# Patient Record
Sex: Female | Born: 1965
Health system: Southern US, Community
[De-identification: ages and names within clinical notes are randomized; demographics above are authoritative.]

## PROBLEM LIST (undated history)

## (undated) DIAGNOSIS — E785 Hyperlipidemia, unspecified: Secondary | ICD-10-CM

## (undated) DIAGNOSIS — T7840XA Allergy, unspecified, initial encounter: Secondary | ICD-10-CM

## (undated) DIAGNOSIS — L509 Urticaria, unspecified: Secondary | ICD-10-CM

## (undated) DIAGNOSIS — F32A Depression, unspecified: Secondary | ICD-10-CM

## (undated) DIAGNOSIS — F419 Anxiety disorder, unspecified: Secondary | ICD-10-CM

## (undated) DIAGNOSIS — I10 Essential (primary) hypertension: Secondary | ICD-10-CM

## (undated) HISTORY — PX: TONSILLECTOMY: SUR1361

## (undated) HISTORY — DX: Anxiety disorder, unspecified: F41.9

## (undated) HISTORY — PX: ADENOIDECTOMY: SUR15

## (undated) HISTORY — DX: Allergy, unspecified, initial encounter: T78.40XA

## (undated) HISTORY — DX: Hyperlipidemia, unspecified: E78.5

## (undated) HISTORY — DX: Depression, unspecified: F32.A

## (undated) HISTORY — DX: Urticaria, unspecified: L50.9

## (undated) HISTORY — DX: Essential (primary) hypertension: I10

---

## 2006-04-16 HISTORY — PX: BREAST EXCISIONAL BIOPSY: SUR124

## 2019-05-25 ENCOUNTER — Ambulatory Visit (INDEPENDENT_AMBULATORY_CARE_PROVIDER_SITE_OTHER): Payer: 59 | Admitting: Allergy and Immunology

## 2019-05-25 ENCOUNTER — Encounter: Payer: Self-pay | Admitting: Allergy and Immunology

## 2019-05-25 ENCOUNTER — Other Ambulatory Visit: Payer: Self-pay

## 2019-05-25 VITALS — BP 120/80 | HR 82 | Temp 98.0°F | Resp 16 | Ht 62.0 in | Wt 155.4 lb

## 2019-05-25 DIAGNOSIS — L500 Allergic urticaria: Secondary | ICD-10-CM

## 2019-05-25 DIAGNOSIS — K2970 Gastritis, unspecified, without bleeding: Secondary | ICD-10-CM | POA: Diagnosis not present

## 2019-05-25 DIAGNOSIS — J310 Chronic rhinitis: Secondary | ICD-10-CM | POA: Diagnosis not present

## 2019-05-25 DIAGNOSIS — T7840XD Allergy, unspecified, subsequent encounter: Secondary | ICD-10-CM

## 2019-05-25 DIAGNOSIS — J309 Allergic rhinitis, unspecified: Secondary | ICD-10-CM | POA: Insufficient documentation

## 2019-05-25 HISTORY — DX: Allergic urticaria: L50.0

## 2019-05-25 MED ORDER — AZELASTINE HCL 0.1 % NA SOLN: NASAL | 5 refills | Status: DC

## 2019-05-25 MED ORDER — MUCINEX 600 MG PO TB12: ORAL_TABLET | ORAL | 5 refills | Status: DC

## 2019-05-25 MED ORDER — FAMOTIDINE 20 MG PO TABS: 20.0000 mg | ORAL_TABLET | Freq: Two times a day (BID) | ORAL | 5 refills | Status: DC | PRN

## 2019-05-25 MED ORDER — LEVOCETIRIZINE DIHYDROCHLORIDE 5 MG PO TABS: 5.0000 mg | ORAL_TABLET | Freq: Every day | ORAL | 5 refills | Status: DC | PRN

## 2019-05-25 NOTE — Assessment & Plan Note (Signed)
All seasonal and perennial aeroallergen skin tests are negative despite a positive histamine control.  Intranasal steroids, intranasal antihistamines, and first generation antihistamines are effective for symptoms associated with non-allergic rhinitis.  A prescription has been provided for azelastine nasal spray, one spray per nostril 1-2 times daily as needed. Proper nasal spray technique has been discussed and demonstrated.  Nasal saline lavage (NeilMed) has been recommended as needed and prior to medicated nasal sprays along with instructions for proper administration.  For thick post nasal drainage, add guaifenesin [id number] mg (Mucinex)  twice daily as needed with adequate hydration as discussed.

## 2019-05-25 NOTE — Assessment & Plan Note (Addendum)
Unclear etiology. Skin tests to select food allergens were negative today.  It is possible that hormonal changes may be contributing to the urticaria.  NSAIDs and emotional stress commonly exacerbate urticaria but are not the underlying etiology in this case. Physical urticarias are negative by history (i.e. pressure-induced, temperature, vibration, solar, etc.).  There are no concomitant symptoms concerning for anaphylaxis or constitutional symptoms worrisome for an underlying malignancy. We will rule out other potential etiologies with labs. For symptom relief, patient is to take oral antihistamines as directed.  The following labs have been ordered: FCeRI antibody, anti-thyroglobulin antibody, thyroid peroxidase antibody, tryptase, H. pylori serology, CBC, CMP, ESR, ANA, and alpha-gal panel.  The patient will be called with further recommendations after lab results have returned.  Instructions have been discussed and provided for H1/H2 receptor blockade with titration to find lowest effective dose.  A prescription has been provided for levocetirizine (Xyzal), 5 mg daily as needed.  A prescription has been provided for famotidine (Pepcid), 53m twice daily as needed.  Should there be a significant increase or change in symptoms, a journal is to be kept recording any foods eaten, beverages consumed, medications taken within a 6 hour period prior to the onset of symptoms, as well as record activities being performed, and environmental conditions. For any symptoms concerning for anaphylaxis, 911 is to be called immediately.  If this problem persists or progresses despite treatment plan and without a clear etiology, skin biopsy per dermatology is recommended.

## 2019-05-25 NOTE — Assessment & Plan Note (Signed)
>>  ASSESSMENT AND PLAN FOR CHRONIC RHINITIS WRITTEN ON 05/25/2019  3:28 PM BY BOBBITT, RALPH CARTER, MD  All seasonal and perennial aeroallergen skin tests are negative despite a positive histamine control.  Intranasal steroids, intranasal antihistamines, and first generation antihistamines are effective for symptoms associated with non-allergic rhinitis. A prescription has been provided for azelastine  nasal spray, one spray per nostril 1-2 times daily as needed. Proper nasal spray technique has been discussed and demonstrated. Nasal saline lavage (NeilMed) has been recommended as needed and prior to medicated nasal sprays along with instructions for proper administration. For thick post nasal drainage, add guaifenesin  [phone] mg (Mucinex )  twice daily as needed with adequate hydration as discussed.

## 2019-05-25 NOTE — Progress Notes (Signed)
New Patient Note  RE: Jill Ashley MRN: [id number] DOB: 12/11/1965 Date of Office Visit: 05/25/2019  Referring provider: Johna Roles, PA Primary care provider: Patient, No Pcp Per  Chief Complaint: Urticaria and Allergic Reaction   History of present illness: Jill Ashley is a 54 y.o. female seen today in consultation requested by Thayer Ohm, PA.  Over the past 4 years, Jill Ashley has experienced recurrent episodes of hives. The hives have appeared at different times over her entire body.  The lesions are described as erythematous, raised, and pruritic.  Initially, the lesions resolved completely without residual pigmentation or bruising.  However, more recently the hives have taken 2 days to resolve with residual pigmentation.  The hives have never been painful but are described as exquisitely pruritic.  She denies concomitant angioedema, cardiopulmonary symptoms and GI symptoms. She has not experienced unexpected weight loss, recurrent fevers or drenching night sweats. No specific medication, food, skin care product, detergent, soap, or other environmental triggers have been identified.  She notes that on occasion she feels a hot sensation at nighttime which she believes may be a hot flash.  She will be having her hormone levels checked in 3 days.  The symptoms do not seem to correlate with NSAIDs use or emotional stress.  Jill Ashley has tried to control symptoms with OTC antihistamines which have offered moderate temporary relief. She has not been evaluated and treated in the emergency department for these symptoms. Skin biopsy has not been performed. Recently, she has been experiencing hives a few times a week.  She is scheduled to see a dermatologist in 1 month. She experiences nasal congestion, rhinorrhea, and thick postnasal drainage.  She attempt to control the symptoms with Nasacort and cetirizine.  No significant seasonal symptom variation has been noted nor have specific environmental  triggers been identified.  Assessment and plan: Recurrent urticaria Unclear etiology. Skin tests to select food allergens were negative today.  It is possible that hormonal changes may be contributing to the urticaria.  NSAIDs and emotional stress commonly exacerbate urticaria but are not the underlying etiology in this case. Physical urticarias are negative by history (i.e. pressure-induced, temperature, vibration, solar, etc.).  There are no concomitant symptoms concerning for anaphylaxis or constitutional symptoms worrisome for an underlying malignancy. We will rule out other potential etiologies with labs. For symptom relief, patient is to take oral antihistamines as directed.  The following labs have been ordered: FCeRI antibody, anti-thyroglobulin antibody, thyroid peroxidase antibody, tryptase, H. pylori serology, CBC, CMP, ESR, ANA, and alpha-gal panel.  The patient will be called with further recommendations after lab results have returned.  Instructions have been discussed and provided for H1/H2 receptor blockade with titration to find lowest effective dose.  A prescription has been provided for levocetirizine (Xyzal), 5 mg daily as needed.  A prescription has been provided for famotidine (Pepcid), 70m twice daily as needed.  Should there be a significant increase or change in symptoms, a journal is to be kept recording any foods eaten, beverages consumed, medications taken within a 6 hour period prior to the onset of symptoms, as well as record activities being performed, and environmental conditions. For any symptoms concerning for anaphylaxis, 911 is to be called immediately.  If this problem persists or progresses despite treatment plan and without a clear etiology, skin biopsy per dermatology is recommended.  Chronic rhinitis All seasonal and perennial aeroallergen skin tests are negative despite a positive histamine control.  Intranasal steroids, intranasal antihistamines, and  first  generation antihistamines are effective for symptoms associated with non-allergic rhinitis.  A prescription has been provided for azelastine nasal spray, one spray per nostril 1-2 times daily as needed. Proper nasal spray technique has been discussed and demonstrated.  Nasal saline lavage (NeilMed) has been recommended as needed and prior to medicated nasal sprays along with instructions for proper administration.  For thick post nasal drainage, add guaifenesin [phone] mg (Mucinex)  twice daily as needed with adequate hydration as discussed.   Meds ordered this encounter  Medications  . levocetirizine (XYZAL) 5 MG tablet    Sig: Take 1 tablet (5 mg total) by mouth daily as needed for allergies.    Dispense:  30 tablet    Refill:  5  . famotidine (PEPCID) 20 MG tablet    Sig: Take 1 tablet (20 mg total) by mouth 2 (two) times daily as needed.    Dispense:  60 tablet    Refill:  5  . azelastine (ASTELIN) 0.1 % nasal spray    Sig: 1 spray each nostril 1-2 times daily as needed    Dispense:  30 mL    Refill:  5  . guaiFENesin (MUCINEX) 600 MG 12 hr tablet    Sig: For thick post nasal drainage take 1-2 tablets 2 times daily as needed, use adequate hydration.    Dispense:  60 tablet    Refill:  5    Diagnostics: Environmental skin testing: Negative despite a positive histamine control. Food allergen skin testing: Negative despite a positive histamine control.    Physical examination: Blood pressure 120/80, pulse 82, temperature 98 F (36.7 C), temperature source Temporal, resp. rate 16, height 5' 2"  (1.575 m), weight 155 lb 6.4 oz (70.5 kg), SpO2 97 %.  General: Alert, interactive, in no acute distress. HEENT: TMs pearly gray, turbinates moderately edematous without discharge, post-pharynx mildly erythematous. Neck: Supple without lymphadenopathy. Lungs: Clear to auscultation without wheezing, rhonchi or rales. CV: Normal S1, S2 without murmurs. Abdomen: Nondistended,  nontender. Skin: Warm and dry, without lesions or rashes. Extremities:  No clubbing, cyanosis or edema. Neuro:   Grossly intact.  Review of systems:  Review of systems negative except as noted in HPI / PMHx or noted below: Review of Systems  Constitutional: Negative.   HENT: Negative.   Eyes: Negative.   Respiratory: Negative.   Cardiovascular: Negative.   Gastrointestinal: Negative.   Genitourinary: Negative.   Musculoskeletal: Negative.   Skin: Negative.   Neurological: Negative.   Endo/Heme/Allergies: Negative.   Psychiatric/Behavioral: Negative.     Past medical history:  Past Medical History:  Diagnosis Date  . Urticaria     Past surgical history:  Past Surgical History:  Procedure Laterality Date  . ADENOIDECTOMY    . TONSILLECTOMY      Family history: Family History  Problem Relation Age of Onset  . Diabetes Father   . Hypertension Father     Social history: Social History   Socioeconomic History  . Marital status: Unknown    Spouse name: Not on file  . Number of children: Not on file  . Years of education: Not on file  . Highest education level: Not on file  Occupational History  . Not on file  Tobacco Use  . Smoking status: Former Smoker    Packs/day: 0.25    Years: 11.00    Pack years: 2.75    Types: Cigarettes    Quit date: 2006    Years since quitting: 15.1  . Smokeless tobacco: Never  Used  Substance and Sexual Activity  . Alcohol use: Not on file  . Drug use: Not on file  . Sexual activity: Not on file  Other Topics Concern  . Not on file  Social History Narrative  . Not on file   Social Determinants of Health   Financial Resource Strain:   . Difficulty of Paying Living Expenses: Not on file  Food Insecurity:   . Worried About Charity fundraiser in the Last Year: Not on file  . Ran Out of Food in the Last Year: Not on file  Transportation Needs:   . Lack of Transportation (Medical): Not on file  . Lack of Transportation  (Non-Medical): Not on file  Physical Activity:   . Days of Exercise per Week: Not on file  . Minutes of Exercise per Session: Not on file  Stress:   . Feeling of Stress : Not on file  Social Connections:   . Frequency of Communication with Friends and Family: Not on file  . Frequency of Social Gatherings with Friends and Family: Not on file  . Attends Religious Services: Not on file  . Active Member of Clubs or Organizations: Not on file  . Attends Archivist Meetings: Not on file  . Marital Status: Not on file  Intimate Partner Violence:   . Fear of Current or Ex-Partner: Not on file  . Emotionally Abused: Not on file  . Physically Abused: Not on file  . Sexually Abused: Not on file    Environmental History: The patient lives in a 54 year old apartment with carpeting throughout and central air/heat.  There is mold/water damage in the home.  There is a dog in the home which does not have access to her bedroom.  She is a non-smoker.  Current Outpatient Medications  Medication Sig Dispense Refill  . Ascorbic Acid (VITAMIN C PO) Take by mouth.    Marland Kitchen BIOTIN PO Take by mouth.    . Cholecalciferol (VITAMIN D3 PO) Take by mouth.    . Cyanocobalamin (VITAMIN B-12 PO) Take by mouth.    . GNP EVENING PRIMROSE OIL PO Take by mouth.    . Rhubarb (ESTROVEN COMPLETE PO) Take by mouth.    . traZODone (DESYREL) 50 MG tablet TAKE 1 TABLET BY MOUTH EVERY DAY AT BEDTIME AS NEEDED    . UNABLE TO FIND Med Name: ashwanganda supplement    . azelastine (ASTELIN) 0.1 % nasal spray 1 spray each nostril 1-2 times daily as needed 30 mL 5  . famotidine (PEPCID) 20 MG tablet Take 1 tablet (20 mg total) by mouth 2 (two) times daily as needed. 60 tablet 5  . guaiFENesin (MUCINEX) 600 MG 12 hr tablet For thick post nasal drainage take 1-2 tablets 2 times daily as needed, use adequate hydration. 60 tablet 5  . levocetirizine (XYZAL) 5 MG tablet Take 1 tablet (5 mg total) by mouth daily as needed for  allergies. 30 tablet 5   No current facility-administered medications for this visit.    Known medication allergies: Allergies  Allergen Reactions  . Penicillins Anaphylaxis and Hives    At age 54  . Morphine And Related Other (See Comments)    Hyperventilation, heart racing, nausea    I appreciate the opportunity to take part in Jill Ashley's care. Please do not hesitate to contact me with questions.  Sincerely,   R. Edgar Frisk, MD

## 2019-05-25 NOTE — Patient Instructions (Addendum)
Recurrent urticaria Unclear etiology. Skin tests to select food allergens were negative today.  It is possible that hormonal changes may be contributing to the urticaria.  NSAIDs and emotional stress commonly exacerbate urticaria but are not the underlying etiology in this case. Physical urticarias are negative by history (i.e. pressure-induced, temperature, vibration, solar, etc.).  There are no concomitant symptoms concerning for anaphylaxis or constitutional symptoms worrisome for an underlying malignancy. We will rule out other potential etiologies with labs. For symptom relief, patient is to take oral antihistamines as directed.  The following labs have been ordered: FCeRI antibody, anti-thyroglobulin antibody, thyroid peroxidase antibody, tryptase, H. pylori serology, CBC, CMP, ESR, ANA, and alpha-gal panel.  The patient will be called with further recommendations after lab results have returned.  Instructions have been discussed and provided for H1/H2 receptor blockade with titration to find lowest effective dose.  A prescription has been provided for levocetirizine (Xyzal), 5 mg daily as needed.  A prescription has been provided for famotidine (Pepcid), 32m twice daily as needed.  Should there be a significant increase or change in symptoms, a journal is to be kept recording any foods eaten, beverages consumed, medications taken within a 6 hour period prior to the onset of symptoms, as well as record activities being performed, and environmental conditions. For any symptoms concerning for anaphylaxis, 911 is to be called immediately.  If this problem persists or progresses despite treatment plan and without a clear etiology, skin biopsy per dermatology is recommended.  Chronic rhinitis All seasonal and perennial aeroallergen skin tests are negative despite a positive histamine control.  Intranasal steroids, intranasal antihistamines, and first generation antihistamines are effective for  symptoms associated with non-allergic rhinitis.  A prescription has been provided for azelastine nasal spray, one spray per nostril 1-2 times daily as needed. Proper nasal spray technique has been discussed and demonstrated.  Nasal saline lavage (NeilMed) has been recommended as needed and prior to medicated nasal sprays along with instructions for proper administration.  For thick post nasal drainage, add guaifenesin [phone] mg (Mucinex)  twice daily as needed with adequate hydration as discussed.   When lab results have returned you will be called with further recommendations. With the newly implemented Cures Act, the labs may be visible to you at the same time they become visible to [place] However, the results will typically not be addressed until all of the results are back, so please be patient.  Until you have heard from [place] please continue the treatment plan as outlined on your take home sheet.  Urticaria (Hives)  . Levocetirizine (Xyzal) 5 mg twice a day and famotidine (Pepcid) 20 mg twice a day. If no symptoms for 7-14 days then decrease to. . Levocetirizine (Xyzal) 5 mg twice a day and famotidine (Pepcid) 20 mg once a day.  If no symptoms for 7-14 days then decrease to. . Levocetirizine (Xyzal) 5 mg twice a day.  If no symptoms for 7-14 days then decrease to. . Levocetirizine (Xyzal) 5 mg once a day.  May use Benadryl (diphenhydramine) as needed for breakthrough symptoms       If symptoms return, then step up dosage

## 2019-05-29 LAB — THYROGLOBULIN ANTIBODY: Thyroglobulin Antibody: <1 IU/mL (ref 0.0–0.9)

## 2019-05-29 LAB — H PYLORI, IGM, IGG, IGA AB
H pylori, IgM Abs: <9 units (ref 0.0–8.9)
H. pylori, IgA Abs: <9 units (ref 0.0–8.9)
H. pylori, IgG AbS: 0.14 Index Value (ref 0.00–0.79)

## 2019-05-29 LAB — COMPREHENSIVE METABOLIC PANEL
ALT: 14 IU/L (ref 0–32)
AST: 23 IU/L (ref 0–40)
Albumin/Globulin Ratio: 1.7 (ref 1.2–2.2)
Albumin: 4.4 g/dL (ref 3.8–4.9)
Alkaline Phosphatase: 57 IU/L (ref 39–117)
BUN/Creatinine Ratio: 20 (ref 9–23)
BUN: 17 mg/dL (ref 6–24)
Bilirubin Total: 0.3 mg/dL (ref 0.0–1.2)
CO2: 23 mmol/L (ref 20–29)
Calcium: 9.3 mg/dL (ref 8.7–10.2)
Chloride: 100 mmol/L (ref 96–106)
Creatinine, Ser: 0.85 mg/dL (ref 0.57–1.00)
GFR calc Af Amer: 90 mL/min/{1.73_m2} (ref >59)
GFR calc non Af Amer: 78 mL/min/{1.73_m2} (ref >59)
Globulin, Total: 2.6 g/dL (ref 1.5–4.5)
Glucose: 89 mg/dL (ref 65–99)
Potassium: 4.6 mmol/L (ref 3.5–5.2)
Sodium: 140 mmol/L (ref 134–144)
Total Protein: 7 g/dL (ref 6.0–8.5)

## 2019-05-29 LAB — CBC WITH DIFFERENTIAL/PLATELET
Basophils Absolute: 0 10*3/uL (ref 0.0–0.2)
Basos: 1 %
EOS (ABSOLUTE): 0.1 10*3/uL (ref 0.0–0.4)
Eos: 2 %
Hematocrit: 43 % (ref 34.0–46.6)
Hemoglobin: 14.4 g/dL (ref 11.1–15.9)
Immature Grans (Abs): 0 10*3/uL (ref 0.0–0.1)
Immature Granulocytes: 0 %
Lymphocytes Absolute: 1.5 10*3/uL (ref 0.7–3.1)
Lymphs: 29 %
MCH: 30.4 pg (ref 26.6–33.0)
MCHC: 33.5 g/dL (ref 31.5–35.7)
MCV: 91 fL (ref 79–97)
Monocytes Absolute: 0.5 10*3/uL (ref 0.1–0.9)
Monocytes: 10 %
Neutrophils Absolute: 3 10*3/uL (ref 1.4–7.0)
Neutrophils: 58 %
Platelets: 242 10*3/uL (ref 150–450)
RBC: 4.73 x10E6/uL (ref 3.77–5.28)
RDW: 12.9 % (ref 11.7–15.4)
WBC: 5.2 10*3/uL (ref 3.4–10.8)

## 2019-05-29 LAB — ALPHA-GAL PANEL
Alpha Gal IgE*: <0.1 kU/L (ref <0.10)
Beef (Bos spp) IgE: <0.1 kU/L (ref <0.35)
Class Interpretation: 0
Class Interpretation: 0
Class Interpretation: 0
Lamb/Mutton (Ovis spp) IgE: <0.1 kU/L (ref <0.35)
Pork (Sus spp) IgE: <0.1 kU/L (ref <0.35)

## 2019-05-29 LAB — SEDIMENTATION RATE: Sed Rate: 12 mm/hr (ref 0–40)

## 2019-05-29 LAB — ANA: Anti Nuclear Antibody (ANA): NEGATIVE

## 2019-05-29 LAB — THYROID PEROXIDASE ANTIBODY: Thyroperoxidase Ab SerPl-aCnc: 9 IU/mL (ref 0–34)

## 2019-05-29 LAB — CHRONIC URTICARIA: cu index: 2.3 (ref <10)

## 2019-05-29 LAB — TRYPTASE: Tryptase: 5.4 ug/L (ref 2.2–13.2)

## 2019-06-22 ENCOUNTER — Other Ambulatory Visit: Payer: Self-pay | Admitting: Obstetrics & Gynecology

## 2019-06-22 DIAGNOSIS — Z1231 Encounter for screening mammogram for malignant neoplasm of breast: Secondary | ICD-10-CM

## 2019-06-26 ENCOUNTER — Other Ambulatory Visit: Payer: Self-pay

## 2019-06-26 ENCOUNTER — Ambulatory Visit
Admission: RE | Admit: 2019-06-26 | Discharge: 2019-06-26 | Disposition: A | Discharge status: Home / Self Care | Payer: 59 | Source: Ambulatory Visit | Attending: Obstetrics & Gynecology | Admitting: Obstetrics & Gynecology

## 2019-06-26 DIAGNOSIS — Z1231 Encounter for screening mammogram for malignant neoplasm of breast: Secondary | ICD-10-CM

## 2019-07-06 ENCOUNTER — Other Ambulatory Visit: Payer: Self-pay | Admitting: Obstetrics & Gynecology

## 2019-07-06 DIAGNOSIS — R928 Other abnormal and inconclusive findings on diagnostic imaging of breast: Secondary | ICD-10-CM

## 2019-07-15 ENCOUNTER — Other Ambulatory Visit: Payer: Self-pay

## 2019-07-15 ENCOUNTER — Ambulatory Visit
Admission: RE | Admit: 2019-07-15 | Discharge: 2019-07-15 | Disposition: A | Discharge status: Home / Self Care | Payer: 59 | Source: Ambulatory Visit | Attending: Obstetrics & Gynecology | Admitting: Obstetrics & Gynecology

## 2019-07-15 DIAGNOSIS — R928 Other abnormal and inconclusive findings on diagnostic imaging of breast: Secondary | ICD-10-CM

## 2019-07-20 ENCOUNTER — Other Ambulatory Visit (HOSPITAL_COMMUNITY): Payer: Self-pay | Admitting: Physician Assistant

## 2020-02-16 DIAGNOSIS — M9901 Segmental and somatic dysfunction of cervical region: Secondary | ICD-10-CM | POA: Diagnosis not present

## 2020-02-16 DIAGNOSIS — M5116 Intervertebral disc disorders with radiculopathy, lumbar region: Secondary | ICD-10-CM | POA: Diagnosis not present

## 2020-02-16 DIAGNOSIS — M9903 Segmental and somatic dysfunction of lumbar region: Secondary | ICD-10-CM | POA: Diagnosis not present

## 2020-02-16 DIAGNOSIS — M50322 Other cervical disc degeneration at C5-C6 level: Secondary | ICD-10-CM | POA: Diagnosis not present

## 2020-02-17 DIAGNOSIS — M9901 Segmental and somatic dysfunction of cervical region: Secondary | ICD-10-CM | POA: Diagnosis not present

## 2020-02-17 DIAGNOSIS — M5116 Intervertebral disc disorders with radiculopathy, lumbar region: Secondary | ICD-10-CM | POA: Diagnosis not present

## 2020-02-17 DIAGNOSIS — M9903 Segmental and somatic dysfunction of lumbar region: Secondary | ICD-10-CM | POA: Diagnosis not present

## 2020-02-17 DIAGNOSIS — M50322 Other cervical disc degeneration at C5-C6 level: Secondary | ICD-10-CM | POA: Diagnosis not present

## 2020-02-18 DIAGNOSIS — M9903 Segmental and somatic dysfunction of lumbar region: Secondary | ICD-10-CM | POA: Diagnosis not present

## 2020-02-18 DIAGNOSIS — M5116 Intervertebral disc disorders with radiculopathy, lumbar region: Secondary | ICD-10-CM | POA: Diagnosis not present

## 2020-02-18 DIAGNOSIS — M9901 Segmental and somatic dysfunction of cervical region: Secondary | ICD-10-CM | POA: Diagnosis not present

## 2020-02-18 DIAGNOSIS — M50322 Other cervical disc degeneration at C5-C6 level: Secondary | ICD-10-CM | POA: Diagnosis not present

## 2020-02-22 DIAGNOSIS — M9903 Segmental and somatic dysfunction of lumbar region: Secondary | ICD-10-CM | POA: Diagnosis not present

## 2020-02-22 DIAGNOSIS — M5116 Intervertebral disc disorders with radiculopathy, lumbar region: Secondary | ICD-10-CM | POA: Diagnosis not present

## 2020-02-22 DIAGNOSIS — M50322 Other cervical disc degeneration at C5-C6 level: Secondary | ICD-10-CM | POA: Diagnosis not present

## 2020-02-22 DIAGNOSIS — M9901 Segmental and somatic dysfunction of cervical region: Secondary | ICD-10-CM | POA: Diagnosis not present

## 2020-02-23 ENCOUNTER — Other Ambulatory Visit (HOSPITAL_COMMUNITY): Payer: Self-pay | Admitting: Obstetrics and Gynecology

## 2020-02-23 DIAGNOSIS — Z780 Asymptomatic menopausal state: Secondary | ICD-10-CM | POA: Diagnosis not present

## 2020-02-23 DIAGNOSIS — N951 Menopausal and female climacteric states: Secondary | ICD-10-CM | POA: Diagnosis not present

## 2020-02-23 MED FILL — GABAPENTIN 100 MG CAPSULE: 100 | 30 days supply | Qty: 120 | Fill #0

## 2020-02-24 ENCOUNTER — Other Ambulatory Visit (HOSPITAL_COMMUNITY): Payer: Self-pay | Admitting: Physician Assistant

## 2020-02-24 DIAGNOSIS — M9903 Segmental and somatic dysfunction of lumbar region: Secondary | ICD-10-CM | POA: Diagnosis not present

## 2020-02-24 DIAGNOSIS — I10 Essential (primary) hypertension: Secondary | ICD-10-CM | POA: Diagnosis not present

## 2020-02-24 DIAGNOSIS — M5442 Lumbago with sciatica, left side: Secondary | ICD-10-CM | POA: Diagnosis not present

## 2020-02-24 DIAGNOSIS — G8929 Other chronic pain: Secondary | ICD-10-CM | POA: Diagnosis not present

## 2020-02-24 DIAGNOSIS — M50322 Other cervical disc degeneration at C5-C6 level: Secondary | ICD-10-CM | POA: Diagnosis not present

## 2020-02-24 DIAGNOSIS — M9901 Segmental and somatic dysfunction of cervical region: Secondary | ICD-10-CM | POA: Diagnosis not present

## 2020-02-24 DIAGNOSIS — R002 Palpitations: Secondary | ICD-10-CM | POA: Diagnosis not present

## 2020-02-24 DIAGNOSIS — M5116 Intervertebral disc disorders with radiculopathy, lumbar region: Secondary | ICD-10-CM | POA: Diagnosis not present

## 2020-02-24 DIAGNOSIS — Z23 Encounter for immunization: Secondary | ICD-10-CM | POA: Diagnosis not present

## 2020-02-24 MED FILL — AMLODIPINE BESYLATE 5 MG TA: 5 | 30 days supply | Qty: 30 | Fill #0

## 2020-03-08 DIAGNOSIS — M5442 Lumbago with sciatica, left side: Secondary | ICD-10-CM | POA: Diagnosis not present

## 2020-03-15 DIAGNOSIS — M5442 Lumbago with sciatica, left side: Secondary | ICD-10-CM | POA: Diagnosis not present

## 2020-03-16 DIAGNOSIS — M25552 Pain in left hip: Secondary | ICD-10-CM | POA: Diagnosis not present

## 2020-03-16 DIAGNOSIS — I10 Essential (primary) hypertension: Secondary | ICD-10-CM | POA: Diagnosis not present

## 2020-03-17 DIAGNOSIS — M5442 Lumbago with sciatica, left side: Secondary | ICD-10-CM | POA: Diagnosis not present

## 2020-03-21 MED FILL — AMLODIPINE BESYLATE 5 MG TA: 5 | 90 days supply | Qty: 90 | Fill #1

## 2020-03-22 ENCOUNTER — Other Ambulatory Visit (HOSPITAL_COMMUNITY): Payer: Self-pay | Admitting: Obstetrics and Gynecology

## 2020-03-22 DIAGNOSIS — N951 Menopausal and female climacteric states: Secondary | ICD-10-CM | POA: Diagnosis not present

## 2020-03-22 DIAGNOSIS — I10 Essential (primary) hypertension: Secondary | ICD-10-CM | POA: Diagnosis not present

## 2020-03-22 MED FILL — LO LOESTRIN FE 1-10 TABLET: 1 MG-10 MCG | 28 days supply | Qty: 28 | Fill #0

## 2020-03-23 MED FILL — traZODone HCL 100 MG TABS: 100 | 90 days supply | Qty: 90 | Fill #0

## 2020-03-23 MED FILL — KETOCONAZOLE 2 % SHAM: 2 | 30 days supply | Qty: 120 | Fill #0

## 2020-03-23 MED FILL — FLUCONAZOLE 150 MG TABS: 150 | 14 days supply | Qty: 4 | Fill #0

## 2020-03-29 DIAGNOSIS — M5032 Other cervical disc degeneration, mid-cervical region, unspecified level: Secondary | ICD-10-CM | POA: Diagnosis not present

## 2020-03-29 DIAGNOSIS — M5136 Other intervertebral disc degeneration, lumbar region: Secondary | ICD-10-CM | POA: Diagnosis not present

## 2020-04-04 MED FILL — GABAPENTIN 100 MG CAPSULE: 100 | 30 days supply | Qty: 120 | Fill #1

## 2020-04-17 DIAGNOSIS — Z20828 Contact with and (suspected) exposure to other viral communicable diseases: Secondary | ICD-10-CM | POA: Diagnosis not present

## 2020-05-02 MED FILL — GABAPENTIN 100 MG CAPSULE: 100 | 30 days supply | Qty: 120 | Fill #2

## 2020-05-09 MED FILL — LO LOESTRIN FE 1-10 TABLET: 1 MG-10 MCG | 28 days supply | Qty: 28 | Fill #1

## 2020-05-27 MED FILL — GABAPENTIN 100 MG CAPSULE: 100 | 14 days supply | Qty: 120 | Fill #3

## 2020-06-03 MED FILL — LO LOESTRIN FE 1-10 TABLET: 1 MG-10 MCG | 28 days supply | Qty: 28 | Fill #2

## 2020-06-06 DIAGNOSIS — Z23 Encounter for immunization: Secondary | ICD-10-CM | POA: Diagnosis not present

## 2020-06-06 DIAGNOSIS — I10 Essential (primary) hypertension: Secondary | ICD-10-CM | POA: Diagnosis not present

## 2020-06-06 DIAGNOSIS — M25642 Stiffness of left hand, not elsewhere classified: Secondary | ICD-10-CM | POA: Diagnosis not present

## 2020-06-06 DIAGNOSIS — F331 Major depressive disorder, recurrent, moderate: Secondary | ICD-10-CM | POA: Diagnosis not present

## 2020-06-06 DIAGNOSIS — N951 Menopausal and female climacteric states: Secondary | ICD-10-CM | POA: Diagnosis not present

## 2020-06-06 DIAGNOSIS — Z0000 Encounter for general adult medical examination without abnormal findings: Secondary | ICD-10-CM | POA: Diagnosis not present

## 2020-06-06 DIAGNOSIS — M25641 Stiffness of right hand, not elsewhere classified: Secondary | ICD-10-CM | POA: Diagnosis not present

## 2020-06-06 DIAGNOSIS — F5101 Primary insomnia: Secondary | ICD-10-CM | POA: Diagnosis not present

## 2020-06-06 DIAGNOSIS — M25462 Effusion, left knee: Secondary | ICD-10-CM | POA: Diagnosis not present

## 2020-06-22 ENCOUNTER — Other Ambulatory Visit (HOSPITAL_COMMUNITY): Payer: Self-pay | Admitting: Physician Assistant

## 2020-06-22 MED FILL — GABAPENTIN 300 MG CAPSULE: 300 | 30 days supply | Qty: 60 | Fill #0

## 2020-06-23 ENCOUNTER — Other Ambulatory Visit (HOSPITAL_COMMUNITY): Payer: Self-pay | Admitting: Physician Assistant

## 2020-06-23 MED FILL — AMLODIPINE BESYLATE 5 MG TA: 5 | 90 days supply | Qty: 90 | Fill #0

## 2020-06-28 DIAGNOSIS — M5442 Lumbago with sciatica, left side: Secondary | ICD-10-CM | POA: Diagnosis not present

## 2020-06-28 DIAGNOSIS — M25552 Pain in left hip: Secondary | ICD-10-CM | POA: Diagnosis not present

## 2020-06-28 DIAGNOSIS — M25562 Pain in left knee: Secondary | ICD-10-CM | POA: Diagnosis not present

## 2020-06-28 DIAGNOSIS — M545 Low back pain, unspecified: Secondary | ICD-10-CM | POA: Diagnosis not present

## 2020-06-29 ENCOUNTER — Other Ambulatory Visit: Payer: Self-pay | Admitting: Orthopedic Surgery

## 2020-06-29 DIAGNOSIS — M25652 Stiffness of left hip, not elsewhere classified: Secondary | ICD-10-CM

## 2020-07-12 DIAGNOSIS — Z79890 Hormone replacement therapy (postmenopausal): Secondary | ICD-10-CM | POA: Diagnosis not present

## 2020-07-12 DIAGNOSIS — R102 Pelvic and perineal pain: Secondary | ICD-10-CM | POA: Diagnosis not present

## 2020-07-20 MED FILL — Norethin-Eth Estradiol-Fe Tab 1 MG-10 MCG (24)/10 MCG (2): ORAL | 28 days supply | Qty: 28 | Fill #0 | Status: AC

## 2020-07-21 ENCOUNTER — Other Ambulatory Visit (HOSPITAL_COMMUNITY): Payer: Self-pay

## 2020-07-21 ENCOUNTER — Ambulatory Visit
Admission: RE | Admit: 2020-07-21 | Discharge: 2020-07-21 | Disposition: A | Discharge status: Home / Self Care | Payer: 59 | Source: Ambulatory Visit | Attending: Orthopedic Surgery | Admitting: Orthopedic Surgery

## 2020-07-21 ENCOUNTER — Other Ambulatory Visit: Payer: Self-pay

## 2020-07-21 DIAGNOSIS — M25652 Stiffness of left hip, not elsewhere classified: Secondary | ICD-10-CM

## 2020-07-21 DIAGNOSIS — M1712 Unilateral primary osteoarthritis, left knee: Secondary | ICD-10-CM | POA: Diagnosis not present

## 2020-07-21 DIAGNOSIS — M948X6 Other specified disorders of cartilage, lower leg: Secondary | ICD-10-CM | POA: Diagnosis not present

## 2020-07-21 DIAGNOSIS — M25562 Pain in left knee: Secondary | ICD-10-CM | POA: Diagnosis not present

## 2020-07-21 DIAGNOSIS — S8992XA Unspecified injury of left lower leg, initial encounter: Secondary | ICD-10-CM | POA: Diagnosis not present

## 2020-07-21 DIAGNOSIS — S83242A Other tear of medial meniscus, current injury, left knee, initial encounter: Secondary | ICD-10-CM | POA: Diagnosis not present

## 2020-07-21 MED FILL — Gabapentin Cap 300 MG: ORAL | 30 days supply | Qty: 60 | Fill #0 | Status: AC

## 2020-07-22 ENCOUNTER — Other Ambulatory Visit (HOSPITAL_COMMUNITY): Payer: Self-pay

## 2020-08-02 DIAGNOSIS — M25562 Pain in left knee: Secondary | ICD-10-CM | POA: Diagnosis not present

## 2020-08-15 MED FILL — Norethin-Eth Estradiol-Fe Tab 1 MG-10 MCG (24)/10 MCG (2): ORAL | 28 days supply | Qty: 28 | Fill #1 | Status: AC

## 2020-08-16 ENCOUNTER — Other Ambulatory Visit (HOSPITAL_COMMUNITY): Payer: Self-pay

## 2020-08-21 MED FILL — Gabapentin Cap 300 MG: ORAL | 30 days supply | Qty: 60 | Fill #1 | Status: AC

## 2020-08-22 ENCOUNTER — Other Ambulatory Visit (HOSPITAL_COMMUNITY): Payer: Self-pay

## 2020-09-04 ENCOUNTER — Other Ambulatory Visit (HOSPITAL_COMMUNITY): Payer: Self-pay

## 2020-09-06 ENCOUNTER — Other Ambulatory Visit (HOSPITAL_COMMUNITY): Payer: Self-pay

## 2020-09-06 MED ORDER — TRAZODONE HCL 50 MG PO TABS
50.0000 mg | ORAL_TABLET | Freq: Every evening | ORAL | 3 refills | Status: AC | PRN
  Filled 2020-09-06: qty 90, 90d supply, fill #0
  Filled 2020-11-14: qty 90, 90d supply, fill #1

## 2020-09-06 MED FILL — Norethin-Eth Estradiol-Fe Tab 1 MG-10 MCG (24)/10 MCG (2): ORAL | 28 days supply | Qty: 28 | Fill #2 | Status: AC

## 2020-09-07 ENCOUNTER — Other Ambulatory Visit (HOSPITAL_COMMUNITY): Payer: Self-pay

## 2020-09-22 ENCOUNTER — Other Ambulatory Visit (HOSPITAL_COMMUNITY): Payer: Self-pay

## 2020-09-23 ENCOUNTER — Other Ambulatory Visit (HOSPITAL_COMMUNITY): Payer: Self-pay

## 2020-09-23 MED ORDER — GABAPENTIN 300 MG PO CAPS
300.0000 mg | ORAL_CAPSULE | Freq: Two times a day (BID) | ORAL | 2 refills | Status: DC
  Filled 2020-09-23: qty 60, 30d supply, fill #0
  Filled 2020-11-09: qty 60, 30d supply, fill #1
  Filled 2020-12-05 – 2020-12-08 (×3): qty 60, 30d supply, fill #2

## 2020-09-29 ENCOUNTER — Other Ambulatory Visit (HOSPITAL_COMMUNITY): Payer: Self-pay

## 2020-09-29 MED FILL — Norethin-Eth Estradiol-Fe Tab 1 MG-10 MCG (24)/10 MCG (2): ORAL | 28 days supply | Qty: 28 | Fill #3 | Status: AC

## 2020-09-29 MED FILL — Amlodipine Besylate Tab 5 MG (Base Equivalent): ORAL | 90 days supply | Qty: 90 | Fill #0 | Status: AC

## 2020-10-04 DIAGNOSIS — N951 Menopausal and female climacteric states: Secondary | ICD-10-CM | POA: Diagnosis not present

## 2020-10-04 DIAGNOSIS — Z79890 Hormone replacement therapy (postmenopausal): Secondary | ICD-10-CM | POA: Diagnosis not present

## 2020-10-05 ENCOUNTER — Other Ambulatory Visit (HOSPITAL_COMMUNITY): Payer: Self-pay

## 2020-10-05 MED ORDER — BIJUVA 1-100 MG PO CAPS
1.0000 | ORAL_CAPSULE | Freq: Every evening | ORAL | 4 refills | Status: DC
  Filled 2020-10-05 – 2020-10-10 (×3): qty 90, 90d supply, fill #0

## 2020-10-07 ENCOUNTER — Other Ambulatory Visit (HOSPITAL_COMMUNITY): Payer: Self-pay

## 2020-10-10 ENCOUNTER — Other Ambulatory Visit (HOSPITAL_COMMUNITY): Payer: Self-pay

## 2020-10-14 ENCOUNTER — Other Ambulatory Visit (HOSPITAL_COMMUNITY): Payer: Self-pay

## 2020-10-14 MED ORDER — PROGESTERONE MICRONIZED 100 MG PO CAPS
100.0000 mg | ORAL_CAPSULE | Freq: Every day | ORAL | 3 refills | Status: AC
  Filled 2020-10-14: qty 30, 30d supply, fill #0
  Filled 2020-11-11: qty 30, 30d supply, fill #1
  Filled 2020-12-05: qty 30, 30d supply, fill #2
  Filled 2021-01-10: qty 30, 30d supply, fill #3

## 2020-10-14 MED ORDER — PREMARIN 0.3 MG PO TABS
0.3000 mg | ORAL_TABLET | Freq: Every day | ORAL | 3 refills | Status: DC
  Filled 2020-10-14: qty 30, 30d supply, fill #0

## 2020-11-02 ENCOUNTER — Other Ambulatory Visit (HOSPITAL_COMMUNITY): Payer: Self-pay

## 2020-11-02 DIAGNOSIS — M545 Low back pain, unspecified: Secondary | ICD-10-CM | POA: Diagnosis not present

## 2020-11-02 MED ORDER — PREMARIN 0.45 MG PO TABS
0.4500 mg | ORAL_TABLET | Freq: Every day | ORAL | 3 refills | Status: DC
  Filled 2020-11-02 – 2020-11-03 (×2): qty 30, 30d supply, fill #0
  Filled 2020-11-29: qty 30, 30d supply, fill #1
  Filled 2020-12-31: qty 30, 30d supply, fill #2

## 2020-11-03 ENCOUNTER — Other Ambulatory Visit (HOSPITAL_COMMUNITY): Payer: Self-pay

## 2020-11-05 DIAGNOSIS — M545 Low back pain, unspecified: Secondary | ICD-10-CM | POA: Diagnosis not present

## 2020-11-09 DIAGNOSIS — M545 Low back pain, unspecified: Secondary | ICD-10-CM | POA: Diagnosis not present

## 2020-11-10 ENCOUNTER — Other Ambulatory Visit (HOSPITAL_COMMUNITY): Payer: Self-pay

## 2020-11-11 ENCOUNTER — Other Ambulatory Visit (HOSPITAL_COMMUNITY): Payer: Self-pay

## 2020-11-11 MED ORDER — CARESTART COVID-19 HOME TEST VI KIT
PACK | 0 refills | Status: DC
  Filled 2020-11-11: qty 4, 4d supply, fill #0

## 2020-11-14 ENCOUNTER — Other Ambulatory Visit (HOSPITAL_COMMUNITY): Payer: Self-pay

## 2020-11-18 ENCOUNTER — Other Ambulatory Visit (HOSPITAL_COMMUNITY): Payer: Self-pay

## 2020-11-21 ENCOUNTER — Other Ambulatory Visit (HOSPITAL_COMMUNITY): Payer: Self-pay

## 2020-11-23 DIAGNOSIS — M545 Low back pain, unspecified: Secondary | ICD-10-CM | POA: Diagnosis not present

## 2020-11-29 ENCOUNTER — Other Ambulatory Visit (HOSPITAL_COMMUNITY): Payer: Self-pay

## 2020-11-30 ENCOUNTER — Other Ambulatory Visit (HOSPITAL_COMMUNITY): Payer: Self-pay

## 2020-12-06 ENCOUNTER — Other Ambulatory Visit (HOSPITAL_COMMUNITY): Payer: Self-pay

## 2020-12-07 ENCOUNTER — Other Ambulatory Visit (HOSPITAL_COMMUNITY): Payer: Self-pay

## 2020-12-08 ENCOUNTER — Other Ambulatory Visit (HOSPITAL_COMMUNITY): Payer: Self-pay

## 2020-12-14 ENCOUNTER — Other Ambulatory Visit (HOSPITAL_COMMUNITY): Payer: Self-pay

## 2020-12-14 DIAGNOSIS — I10 Essential (primary) hypertension: Secondary | ICD-10-CM | POA: Diagnosis not present

## 2020-12-14 DIAGNOSIS — F331 Major depressive disorder, recurrent, moderate: Secondary | ICD-10-CM | POA: Diagnosis not present

## 2020-12-14 MED ORDER — VENLAFAXINE HCL 37.5 MG PO TABS
37.5000 mg | ORAL_TABLET | Freq: Every day | ORAL | 6 refills | Status: DC
  Filled 2020-12-14: qty 30, 30d supply, fill #0

## 2020-12-22 ENCOUNTER — Other Ambulatory Visit (HOSPITAL_COMMUNITY): Payer: Self-pay

## 2020-12-30 ENCOUNTER — Other Ambulatory Visit (HOSPITAL_COMMUNITY): Payer: Self-pay

## 2020-12-30 MED ORDER — PREMARIN 0.625 MG PO TABS
0.6250 mg | ORAL_TABLET | Freq: Every day | ORAL | 3 refills | Status: DC
  Filled 2020-12-30: qty 30, 30d supply, fill #0

## 2020-12-31 MED FILL — Amlodipine Besylate Tab 5 MG (Base Equivalent): ORAL | 90 days supply | Qty: 90 | Fill #1 | Status: AC

## 2021-01-02 ENCOUNTER — Other Ambulatory Visit (HOSPITAL_COMMUNITY): Payer: Self-pay

## 2021-01-10 ENCOUNTER — Other Ambulatory Visit (HOSPITAL_COMMUNITY): Payer: Self-pay

## 2021-01-10 MED ORDER — GABAPENTIN 300 MG PO CAPS
300.0000 mg | ORAL_CAPSULE | Freq: Two times a day (BID) | ORAL | 1 refills | Status: DC
  Filled 2021-01-10: qty 180, 90d supply, fill #0

## 2021-01-10 MED ORDER — GABAPENTIN 300 MG PO CAPS
300.0000 mg | ORAL_CAPSULE | Freq: Two times a day (BID) | ORAL | 6 refills | Status: DC
  Filled 2021-01-10: qty 60, 30d supply, fill #0

## 2021-01-24 DIAGNOSIS — N898 Other specified noninflammatory disorders of vagina: Secondary | ICD-10-CM | POA: Diagnosis not present

## 2021-01-24 DIAGNOSIS — N951 Menopausal and female climacteric states: Secondary | ICD-10-CM | POA: Diagnosis not present

## 2021-01-24 DIAGNOSIS — Z79890 Hormone replacement therapy (postmenopausal): Secondary | ICD-10-CM | POA: Diagnosis not present

## 2021-01-26 ENCOUNTER — Other Ambulatory Visit (HOSPITAL_COMMUNITY): Payer: Self-pay

## 2021-02-15 DIAGNOSIS — N951 Menopausal and female climacteric states: Secondary | ICD-10-CM | POA: Diagnosis not present

## 2021-02-21 ENCOUNTER — Other Ambulatory Visit (HOSPITAL_BASED_OUTPATIENT_CLINIC_OR_DEPARTMENT_OTHER): Payer: Self-pay

## 2021-02-21 MED ORDER — INFLUENZA VAC SPLIT QUAD 0.5 ML IM SUSY
PREFILLED_SYRINGE | INTRAMUSCULAR | 0 refills | Status: DC
  Filled 2021-02-21: qty 0.5, 1d supply, fill #0

## 2021-04-05 ENCOUNTER — Ambulatory Visit: Payer: 59 | Admitting: Orthopaedic Surgery

## 2021-04-06 ENCOUNTER — Ambulatory Visit (INDEPENDENT_AMBULATORY_CARE_PROVIDER_SITE_OTHER): Payer: BC Managed Care – PPO | Admitting: Orthopaedic Surgery

## 2021-04-06 ENCOUNTER — Other Ambulatory Visit: Payer: Self-pay

## 2021-04-06 ENCOUNTER — Ambulatory Visit (HOSPITAL_BASED_OUTPATIENT_CLINIC_OR_DEPARTMENT_OTHER)
Admission: RE | Admit: 2021-04-06 | Discharge: 2021-04-06 | Disposition: A | Discharge status: Home / Self Care | Payer: BC Managed Care – PPO | Source: Ambulatory Visit | Attending: Orthopaedic Surgery | Admitting: Orthopaedic Surgery

## 2021-04-06 DIAGNOSIS — M25559 Pain in unspecified hip: Secondary | ICD-10-CM | POA: Diagnosis not present

## 2021-04-06 DIAGNOSIS — M25852 Other specified joint disorders, left hip: Secondary | ICD-10-CM

## 2021-04-06 DIAGNOSIS — M25552 Pain in left hip: Secondary | ICD-10-CM | POA: Diagnosis not present

## 2021-04-06 NOTE — Progress Notes (Signed)
Chief Complaint: left hip pain     History of Present Illness:    Jill Ashley is a 55 y.o. female presents today with left hip pain since 2019 which she describes as deep gluteal pain.  She not have any specific injury or accident.  She works at [employer] and enjoys being very active.  She states that the pain occurs specifically when laying on the side.  The symptoms in the posterior aspect of the hip.  She has not had injections in the past.  She has had physical therapy in the past although this was aggravating her pain somewhat.  She does have a history of lower back pain but denies pain radiating down the leg.  She states that when she is standing she will flex at the knee in order to relieve her pain.  She takes ibuprofen and muscle relaxers which helps somewhat.    Surgical History:   None  PMH/PSH/Family History/Social History/Meds/Allergies:    Past Medical History:  Diagnosis Date   Urticaria    Past Surgical History:  Procedure Laterality Date   ADENOIDECTOMY     BREAST EXCISIONAL BIOPSY Left 2008   [place], [place]   TONSILLECTOMY     Social History   Socioeconomic History   Marital status: Married    Spouse name: Not on file   Number of children: Not on file   Years of education: Not on file   Highest education level: Not on file  Occupational History   Not on file  Tobacco Use   Smoking status: Former    Packs/day: 0.25    Years: 11.00    Pack years: 2.75    Types: Cigarettes    Quit date: 2006    Years since quitting: 16.9   Smokeless tobacco: Never  Vaping Use   Vaping Use: Never used  Substance and Sexual Activity   Alcohol use: Not on file   Drug use: Not on file   Sexual activity: Not on file  Other Topics Concern   Not on file  Social History Narrative   Not on file   Social Determinants of Health   Financial Resource Strain: Not on file  Food Insecurity: Not on file  Transportation Needs: Not  on file  Physical Activity: Not on file  Stress: Not on file  Social Connections: Not on file   Family History  Problem Relation Age of Onset   Diabetes Father    Hypertension Father    Allergies  Allergen Reactions   Penicillins Anaphylaxis and Hives    At age 14   Morphine And Related Other (See Comments)    Hyperventilation, heart racing, nausea   Current Outpatient Medications  Medication Sig Dispense Refill   amLODipine (NORVASC) 5 MG tablet TAKE 1 TABLET BY MOUTH ONCE A DAY 90 tablet 2   amLODipine (NORVASC) 5 MG tablet TAKE 1 TABLET BY MOUTH ONCE DAILY 30 tablet 3   Ascorbic Acid (VITAMIN C PO) Take by mouth.     azelastine (ASTELIN) 0.1 % nasal spray 1 spray each nostril 1-2 times daily as needed 30 mL 5   BIOTIN PO Take by mouth.     Cholecalciferol (VITAMIN D3 PO) Take by mouth.     COVID-19 At Home Antigen Test [facility] COVID-19 HOME TEST) KIT Use as  directed 4 each 0   Cyanocobalamin (VITAMIN B-12 PO) Take by mouth.     Estradiol-Progesterone (BIJUVA) 1-100 MG CAPS Take 1 capsule by mouth every evening. 90 capsule 4   estrogens, conjugated, (PREMARIN) 0.3 MG tablet Take 1 tablet (0.3 mg total) by mouth daily. 30 tablet 3   estrogens, conjugated, (PREMARIN) 0.45 MG tablet Take 1 tablet (0.45 mg total) by mouth daily. 30 tablet 3   estrogens, conjugated, (PREMARIN) 0.625 MG tablet Take 1 tablet (0.625 mg total) by mouth daily. 30 tablet 3   famotidine (PEPCID) 20 MG tablet Take 1 tablet (20 mg total) by mouth 2 (two) times daily as needed. 60 tablet 5   gabapentin (NEURONTIN) 100 MG capsule TAKE 1 CAPSULE BY MOUTH ONCE A DAY MAY TITRATE UP BY 100MG EACH WEEK UP TO 900MG 120 capsule 3   gabapentin (NEURONTIN) 300 MG capsule Take 1 capsule (300 mg total) by mouth 2 (two) times daily. 60 capsule 6   gabapentin (NEURONTIN) 300 MG capsule Take 1 capsule (300 mg total) by mouth 2 (two) times daily. 180 capsule 1   GNP EVENING PRIMROSE OIL PO Take by mouth.     guaiFENesin  (MUCINEX) 600 MG 12 hr tablet For thick post nasal drainage take 1-2 tablets 2 times daily as needed, use adequate hydration. 60 tablet 5   influenza vac split quadrivalent PF (FLUARIX) 0.5 ML injection Inject into the muscle. 0.5 mL 0   levocetirizine (XYZAL) 5 MG tablet Take 1 tablet (5 mg total) by mouth daily as needed for allergies. 30 tablet 5   Norethindrone-Ethinyl Estradiol-Fe Biphas (LO LOESTRIN FE) 1 MG-10 MCG / 10 MCG tablet TAKE 1 TABLET BY MOUTH ONCE A DAY 28 tablet 12   progesterone (PROMETRIUM) 100 MG capsule Take 1 capsule (100 mg total) by mouth daily as directed 30 capsule 3   Rhubarb (ESTROVEN COMPLETE PO) Take by mouth.     traZODone (DESYREL) 50 MG tablet TAKE 1 TABLET BY MOUTH EVERY DAY AT BEDTIME AS NEEDED     traZODone (DESYREL) 50 MG tablet Take 1 tablet (50 mg total) by mouth at bedtime as needed. 90 tablet 3   UNABLE TO FIND Med Name: Christiana Fuchs supplement     venlafaxine (EFFEXOR) 37.5 MG tablet Take 1 tablet (37.5 mg total) by mouth daily with food 30 tablet 6   No current facility-administered medications for this visit.   No results found.  Review of Systems:   A ROS was performed including pertinent positives and negatives as documented in the HPI.  Physical Exam :   Constitutional: NAD and appears stated age Neurological: Alert and oriented Psych: Appropriate affect and cooperative There were no vitals taken for this visit.   Comprehensive Musculoskeletal Exam:    Inspection Right Left  Skin No atrophy or gross abnormalities appreciated No atrophy or gross abnormalities appreciated  Palpation    Tenderness None None  Crepitus None None  Range of Motion    Flexion (passive) 120 120  Extension 30 30  IR 30 30 some pain  ER 45 45 no pain  Strength    Flexion  5/5 5/5  Extension 5/5 5/5  Special Tests    FABIR Negative Negative  FADER Negative Negative  ER Lag/Capsular Insufficiency Negative Negative  Instability Negative Negative  Sacroiliac  pain Negative  Negative   Instability    Generalized Laxity No No  Neurologic    sciatic, femoral, obturator nerves intact to light sensation  Vascular/Lymphatic  DP pulse 2+ 2+  Lumbar Exam    Patient has symmetric lumbar range of motion with negative pain referral to hip  Full strength with abduction on the side.  No pain with resisted hamstring curl prone.    Imaging:   Xray (3 views left hip): Formal   I personally reviewed and interpreted the radiographs.   Assessment:   55 year old female with left deep posterior gluteal pain.  I discussed the differential diagnosis with her including ischial hamstring tendinitis versus ischiofemoral impingement pain.  There different parts of her history that speak towards each.  The fact that she is unable to lay on the side speaks more to ischiofemoral impingement whereas the fact that she must bend her knee to relieve pressure speak somewhat more towards proximal hamstring issues.  I discussed that ultimately I would like her undergo physical therapy for stretching of her short external rotators as well as the hamstring in addition to a core strengthening program.  I believe that this would help all of her issues ultimately.  I will see her back in 6 weeks.  We discussed that we could obtain an MRI at that time in order to specifically target the problem area and to further discuss additional treatment.  Plan :    -Physical therapy for short external rotators stretching as well as hamstring stretching and core strengthening -Return to clinic 6 weeks     I personally saw and evaluated the patient, and participated in the management and treatment plan.  Vanetta Mulders, MD Attending Physician, Orthopedic Surgery  This document was dictated using Dragon voice recognition software. A reasonable attempt at proof reading has been made to minimize errors.

## 2021-04-08 ENCOUNTER — Encounter (HOSPITAL_BASED_OUTPATIENT_CLINIC_OR_DEPARTMENT_OTHER): Payer: Self-pay | Admitting: Orthopaedic Surgery

## 2021-04-11 ENCOUNTER — Other Ambulatory Visit (HOSPITAL_COMMUNITY): Payer: Self-pay

## 2021-05-02 ENCOUNTER — Ambulatory Visit (HOSPITAL_BASED_OUTPATIENT_CLINIC_OR_DEPARTMENT_OTHER): Payer: BC Managed Care – PPO | Attending: Orthopaedic Surgery | Admitting: Physical Therapy

## 2021-05-02 ENCOUNTER — Other Ambulatory Visit: Payer: Self-pay

## 2021-05-02 DIAGNOSIS — M25552 Pain in left hip: Secondary | ICD-10-CM | POA: Insufficient documentation

## 2021-05-02 DIAGNOSIS — M6281 Muscle weakness (generalized): Secondary | ICD-10-CM | POA: Diagnosis not present

## 2021-05-02 NOTE — Therapy (Addendum)
OUTPATIENT PHYSICAL THERAPY LOWER EXTREMITY EVALUATION   Patient Name: Jill Ashley MRN: [id number] DOB:[birth year], 56 y.o., female Today's Date: 05/03/2021   PT End of Session - 05/03/21 2254     Visit Number 1    Number of Visits 12    Date for PT Re-Evaluation 06/13/21    PT Start Time 1307    PT Stop Time 1355    PT Time Calculation (min) 48 min    Activity Tolerance Patient tolerated treatment well    Behavior During Therapy [facility] for tasks assessed/performed             Past Medical History:  Diagnosis Date   Urticaria    Past Surgical History:  Procedure Laterality Date   ADENOIDECTOMY     BREAST EXCISIONAL BIOPSY Left 2008   [place], [place]   TONSILLECTOMY     Patient Active Problem List   Diagnosis Date Noted   Recurrent urticaria 05/25/2019   Chronic rhinitis 05/25/2019    PCP: Pcp, No  REFERRING PROVIDER: Huel Cote, MD  REFERRING DIAG: M25.559 (ICD-10-CM) - Hip pain   THERAPY DIAG:  Pain in left hip  Muscle weakness (generalized)  ONSET DATE: Chronic / MD script 04/06/2021  SUBJECTIVE:   SUBJECTIVE STATEMENT: Pt reports her hip pain began in 2019 with no specific MOI.  Pt reports she jumped off of a truck while holding a box in 2018 which bothered her back.  Pt is not sure if this is related.  She has had physical therapy in the past although this was aggravating her pain and she didn't receive relief.   Pt saw ortho MD and he ordered PT for stretching of her short external rotators as well as the hamstring in addition to a core strengthening program.  MD ordered indicated Hamstring stretching, stretching of short external rotators, differential hamstring tendonitis versus ischiofemoral impingement.   Pt had an x ray which showed no acute bony abnormality.  Pt states she doesn't really have back pain, but does have back tightness.  Pt reports her sx's are worsening.  Pt states she can feel pain in her L hip/glute with looking downward.   Pt had a flare up just performing light exercises in the gym.  Pt has pain lying on her L side.  Pt states she has difficulty sleeping due to pain.  "My life has completely changed".  Pt states she feels fine ambulating and performing standing activities.  Pt has pain with repetitive work activities.   Pt was limited with squatting, kneeling, and bending due to L knee though is able to perform now.  Pt didn't really go to the gym in 2022 due to L knee pain.   PERTINENT HISTORY: Chronic hip pain, Hx of L knee pain  PAIN:  Are you having pain? Yes NPRS scale: 6/10 Pain location: L glute; occasional pain down L LE Aggravating factors: lying on L side Relieving factors:   PRECAUTIONS: None  WEIGHT BEARING RESTRICTIONS No  FALLS:  Has patient fallen in last 6 months? No   OCCUPATION: She works at [employer] and enjoys being very active.  Pt states she walks 15,000 steps per day  PLOF: Independent;   PATIENT GOALS improved sleeping; return to gym activities and be able to workout with husband   OBJECTIVE:   DIAGNOSTIC FINDINGS:  x ray which showed no acute bony abnormality.  small soft tissue calcifications adjacent to the greater trochanters bilaterally which may be related to old injury or chronic repetitive  stress injury (In Epic)  PATIENT SURVEYS:  FOTO 74 with a goal of 76  COGNITION:  Overall cognitive status: Within functional limits for tasks assessed       Pt demonstrated a deep squat in the clinic without increased pain.    PALPATION: TTP:  none in GT.  Pt was tender in inferior glute  LE AROM/PROM:  A/PROM Right 05/03/2021 Left 05/03/2021  Hip flexion  117  Hip extension 11 11  Hip abduction [facility]  Hip adduction WNL WNL  Hip internal rotation 28 29  Hip external rotation 32 31  Knee flexion    Knee extension    Ankle dorsiflexion    Ankle plantarflexion    Ankle inversion    Ankle eversion     (Blank rows = not tested)  LE MMT:  MMT  Right 05/03/2021 Left 05/03/2021  Hip flexion 5/5 4/5  Hip extension 5/5 4/5  Hip abduction 5/5 5/5  Hip adduction 5/5 4/5  Hip internal rotation 5/5 5/5  Hip external rotation 5/5 4+/5  Knee flexion 5/5 5/5  Knee extension 5/5 5/5  Ankle dorsiflexion    Ankle plantarflexion    Ankle inversion    Ankle eversion     (Blank rows = not tested)  LOWER EXTREMITY SPECIAL TESTS:  SCOUR TEST:  R:  negative, L:  negative with circumduction, positive with ER/IR FABER:  negative bilat     GAIT: Assistive device utilized: None Level of assistance: Complete Independence Comments: Pt ambulated with a normalized heel to toe gait pattern without limping.  Pt has good gait speed.     TODAY'S TREATMENT: See pt education below.    PATIENT EDUCATION:  Education details: dx, prognosis, objective findings, rationale of exercises, and POC.  Educated pt with relevant anatomy verbally and visually with an illustrated poster.   Person educated: Patient Education method: Explanation and visual Education comprehension: verbalized understanding and needs further education   HOME EXERCISE PROGRAM: Will give next visit  ASSESSMENT:  CLINICAL IMPRESSION: Patient is a 56 y.o. female with a dx of L hip pain presenting to the clinic L hip and LE pain and muscle weakness in L hip.  Pt has chronic pain and has attempted PT in the past without relief.  Pt reports her sx's are worsening.  She has pain lying on her L side and difficulty with sleeping due to pain.  Pt has pain with repetitive work activities and can have a flare up just performing light exercises in the gym.  Patient should benefit from skilled PT to address above impairments and improve overall function.   Objective impairments include decreased activity tolerance, decreased mobility, decreased strength, and pain. These impairments are limiting patient from occupation and workout activities . Personal factors including Time since onset of  injury/illness/exacerbation are also affecting patient's functional outcome.    REHAB POTENTIAL: Good  CLINICAL DECISION MAKING: Stable/uncomplicated  EVALUATION COMPLEXITY: Low   GOALS:   SHORT TERM GOALS:  STG Name Target Date Goal status  1 Pt will be independent and compliant with HEP for improved pain, strength, and function.  Baseline:  05/23/2021 INITIAL  2 Pt will report at least a 25% improvement in pain and sx's overall  Baseline:  05/23/2021 INITIAL                            LONG TERM GOALS:   LTG Name Target Date Goal status  1 Pt will  be able to sleep at least 5/7 nights/week without pain disturbing her. Baseline: 06/13/2021 INITIAL  2 Pt will demo improved core strength by progressing with core ex's without adverse effects and improved L hip strength to 5/5 MMT for improved tolerance with daily activities and improved tolerance with workout activities.  Baseline: 06/13/2021 INITIAL  3 Pt will report improved tolerance with workout activities to allow for pt to return to working out with her husband.  Baseline: 06/13/2021 INITIAL  4 Pt will report she is able to perform her normal work activities without significant pain Baseline: 06/13/2021 INITIAL                  PLAN: PT FREQUENCY: 1-2x/week  PT DURATION: 6 weeks  PLANNED INTERVENTIONS: Therapeutic exercises, Therapeutic activity, Neuro Muscular re-education, Patient/Family education, Joint mobilization, Stair training, Aquatic Therapy, Dry Needling, Electrical stimulation, Spinal mobilization, Cryotherapy, Moist heat, Taping, Traction, Ultrasound, and Manual therapy  PLAN FOR NEXT SESSION: Establish and give HEP.  STM with rolling to glute.  MD note indicated stretching of her short external rotators as well as the hamstring in addition to a core strengthening program.    Audie Clearoby Doninique Lwin III PT, DPT 05/03/21 11:13 PM

## 2021-05-03 ENCOUNTER — Encounter (HOSPITAL_BASED_OUTPATIENT_CLINIC_OR_DEPARTMENT_OTHER): Payer: Self-pay | Admitting: Physical Therapy

## 2021-05-04 ENCOUNTER — Encounter (HOSPITAL_BASED_OUTPATIENT_CLINIC_OR_DEPARTMENT_OTHER): Payer: Self-pay | Admitting: Physical Therapy

## 2021-05-11 ENCOUNTER — Other Ambulatory Visit: Payer: Self-pay

## 2021-05-11 ENCOUNTER — Ambulatory Visit (HOSPITAL_BASED_OUTPATIENT_CLINIC_OR_DEPARTMENT_OTHER): Payer: BC Managed Care – PPO | Admitting: Physical Therapy

## 2021-05-11 ENCOUNTER — Encounter (HOSPITAL_BASED_OUTPATIENT_CLINIC_OR_DEPARTMENT_OTHER): Payer: Self-pay | Admitting: Physical Therapy

## 2021-05-11 DIAGNOSIS — M6281 Muscle weakness (generalized): Secondary | ICD-10-CM | POA: Diagnosis not present

## 2021-05-11 DIAGNOSIS — M25552 Pain in left hip: Secondary | ICD-10-CM

## 2021-05-11 NOTE — Therapy (Signed)
OUTPATIENT PHYSICAL TREATMENT   Patient Name: Jill Ashley MRN: [id number] DOB:28-Dec-1965, 56 y.o., female Today's Date: 05/11/2021   PT End of Session - 05/11/21 1742     Visit Number 2    Number of Visits 12    Date for PT Re-Evaluation 06/13/21    PT Start Time 1515    PT Stop Time 1555    PT Time Calculation (min) 40 min    Activity Tolerance Patient tolerated treatment well    Behavior During Therapy [facility] for tasks assessed/performed              Past Medical History:  Diagnosis Date   Urticaria    Past Surgical History:  Procedure Laterality Date   ADENOIDECTOMY     BREAST EXCISIONAL BIOPSY Left 2008   [place], [place]   TONSILLECTOMY     Patient Active Problem List   Diagnosis Date Noted   Recurrent urticaria 05/25/2019   Chronic rhinitis 05/25/2019    PCP: Pcp, No  REFERRING PROVIDER: Huel Cote, MD  REFERRING DIAG: M25.559 (ICD-10-CM) - Hip pain   THERAPY DIAG:  Pain in left hip  Muscle weakness (generalized)  ONSET DATE: Chronic / MD script 04/06/2021  SUBJECTIVE:   SUBJECTIVE STATEMENT: Pt states she does have continued pain in the hip with pain being worse at night.   PERTINENT HISTORY: Chronic hip pain, Hx of L knee pain  PAIN:  Are you having pain? No NPRS scale: 0/10 Pain location: L glute; occasional pain down L LE Aggravating factors: lying on L side Relieving factors:    OCCUPATION: She works at [employer] and enjoys being very active.  Pt states she walks 15,000 steps per day  PLOF: Independent;   PATIENT GOALS improved sleeping; return to gym activities and be able to workout with husband   OBJECTIVE:   DIAGNOSTIC FINDINGS:  x ray which showed no acute bony abnormality.  small soft tissue calcifications adjacent to the greater trochanters bilaterally which may be related to old injury or chronic repetitive stress injury (In Epic)  PATIENT SURVEYS:  FOTO 74 with a goal of 40  COGNITION:  Overall  cognitive status: Within functional limits for tasks assessed      PALPATION: TTP of hip rotators and gluteals   TODAY'S TREATMENT: STM: L hip gluteals  Self massage with tennis ball Hip ABD iso 5s 10x supine, blue TB Bridge with blue band 20x Seated HS stretch 30s 2x  STS with hip ABD band 2x10 Piriformis stretch 30s 3x, pull towards    PATIENT EDUCATION:  Education details: anatomy, exercise progression, DOMS expectations, muscle firing,  envelope of function, HEP, POC Educated pt with relevant anatomy verbally and visually with an illustrated poster.   Person educated: Patient Education method: Explanation and visual Education comprehension: verbalized understanding and needs further education   HOME EXERCISE PROGRAM: Access Code: [email or web address] URL: [email or web address] Date: 05/11/2021 Prepared by: Zebedee Iba  Exercises Seated Hip Abduction with Resistance - 1 x daily - 5 x weekly - 3 sets - 10 reps Sit to Stand Without Arm Support - 1 x daily - 5 x weekly - 2 sets - 10 reps Seated Piriformis Stretch - 2 x daily - 7 x weekly - 1 sets - 3 reps - 30 hold   ASSESSMENT:  CLINICAL IMPRESSION: Pt with good response to treatment at today's session. Pt without exacerbation of pain today with STM or exercise. Pt able to tolerate isometric holds for analgesic effect and begin gentle loading without  increasing discomfort or lateral hip irritation. Plan to assess response today's treatment and HEP at next session and possibly revisit deep ischemic pressure to L glute and deep hip rotators. Patient should benefit from skilled PT to address above impairments and improve overall function.   Objective impairments include decreased activity tolerance, decreased mobility, decreased strength, and pain. These impairments are limiting patient from occupation and workout activities . Personal factors including Time since onset of injury/illness/exacerbation are also affecting  patient's functional outcome.    REHAB POTENTIAL: Good  CLINICAL DECISION MAKING: Stable/uncomplicated  EVALUATION COMPLEXITY: Low   GOALS:   SHORT TERM GOALS:  STG Name Target Date Goal status  1 Pt will be independent and compliant with HEP for improved pain, strength, and function.  Baseline:  05/23/2021 INITIAL  2 Pt will report at least a 25% improvement in pain and sx's overall  Baseline:  05/23/2021 INITIAL                            LONG TERM GOALS:   LTG Name Target Date Goal status  1 Pt will be able to sleep at least 5/7 nights/week without pain disturbing her. Baseline: 06/13/2021 INITIAL  2 Pt will demo improved core strength by progressing with core ex's without adverse effects and improved L hip strength to 5/5 MMT for improved tolerance with daily activities and improved tolerance with workout activities.  Baseline: 06/13/2021 INITIAL  3 Pt will report improved tolerance with workout activities to allow for pt to return to working out with her husband.  Baseline: 06/13/2021 INITIAL  4 Pt will report she is able to perform her normal work activities without significant pain Baseline: 06/13/2021 INITIAL                  PLAN: PT FREQUENCY: 1-2x/week  PT DURATION: 6 weeks  PLANNED INTERVENTIONS: Therapeutic exercises, Therapeutic activity, Neuro Muscular re-education, Patient/Family education, Joint mobilization, Stair training, Aquatic Therapy, Dry Needling, Electrical stimulation, Spinal mobilization, Cryotherapy, Moist heat, Taping, Traction, Ultrasound, and Manual therapy  PLAN FOR NEXT SESSION: MD note indicated stretching of her short external rotators as well as the hamstring in addition to a core strengthening program.    Zebedee Iba PT, DPT 05/11/21 5:47 PM

## 2021-05-16 ENCOUNTER — Other Ambulatory Visit: Payer: Self-pay

## 2021-05-16 ENCOUNTER — Ambulatory Visit (HOSPITAL_BASED_OUTPATIENT_CLINIC_OR_DEPARTMENT_OTHER): Payer: BC Managed Care – PPO | Admitting: Physical Therapy

## 2021-05-16 DIAGNOSIS — M6281 Muscle weakness (generalized): Secondary | ICD-10-CM | POA: Diagnosis not present

## 2021-05-16 DIAGNOSIS — M25552 Pain in left hip: Secondary | ICD-10-CM

## 2021-05-16 NOTE — Therapy (Signed)
OUTPATIENT PHYSICAL TREATMENT   Patient Name: Jill Ashley MRN: [id number] DOB:05/28/1965, 56 y.o., female Today's Date: 05/16/2021   PT End of Session - 05/16/21 1356     Visit Number 3    Number of Visits 12    Date for PT Re-Evaluation 06/13/21    PT Start Time 1302    PT Stop Time 1345    PT Time Calculation (min) 43 min    Activity Tolerance Patient tolerated treatment well    Behavior During Therapy [facility] for tasks assessed/performed               Past Medical History:  Diagnosis Date   Urticaria    Past Surgical History:  Procedure Laterality Date   ADENOIDECTOMY     BREAST EXCISIONAL BIOPSY Left 2008   [place], [place]   TONSILLECTOMY     Patient Active Problem List   Diagnosis Date Noted   Recurrent urticaria 05/25/2019   Chronic rhinitis 05/25/2019    PCP: Pcp, No  REFERRING PROVIDER: Vanetta Mulders, MD  REFERRING DIAG: M25.559 (ICD-10-CM) - Hip pain   THERAPY DIAG:  Pain in left hip  Muscle weakness (generalized)  ONSET DATE: Chronic / MD script 04/06/2021  SUBJECTIVE:   SUBJECTIVE STATEMENT: Pt states she does not have that constant nagging pain in the hip as much. She states that the self massage and stretching. She does have pain at night still but not as bad. She is able to lay on that side longer now. She feels she is sleeping better.   PERTINENT HISTORY: Chronic hip pain, Hx of L knee pain  PAIN:  Are you having pain? No NPRS scale: 0/10 Pain location: L glute; occasional pain down L LE Aggravating factors: lying on L side Relieving factors:    OCCUPATION: She works at [employer] and enjoys being very active.  Pt states she walks 15,000 steps per day  PLOF: Independent;   PATIENT GOALS improved sleeping; return to gym activities and be able to workout with husband   OBJECTIVE:   DIAGNOSTIC FINDINGS:  x ray which showed no acute bony abnormality.  small soft tissue calcifications adjacent to the greater trochanters  bilaterally which may be related to old injury or chronic repetitive stress injury (In Epic)  PATIENT SURVEYS:  FOTO 74 with a goal of 16  COGNITION:  Overall cognitive status: Within functional limits for tasks assessed      PALPATION: TTP of hip rotators and gluteals   TODAY'S TREATMENT: STM: L hip gluteals  Self massage with tennis ball/foam roller Hip ABD iso 5s 5x supine, blue TB sidestepping Bridge with blue band 20x Seated HS stretch 30s 3x  STS with blue hip ABD band 2x10 Standing hip hike 15x R foot elevated  Piriformis stretch 30s 3x, pull towards    PATIENT EDUCATION:  Education details: anatomy, exercise progression, DOMS expectations, muscle firing,  envelope of function, HEP, POC Educated pt with relevant anatomy verbally and visually with an illustrated poster.   Person educated: Patient Education method: Explanation and visual Education comprehension: verbalized understanding and needs further education   HOME EXERCISE PROGRAM: Access Code: [email or web address] URL: [email or web address] Date: 05/11/2021 Prepared by: Daleen Bo  Exercises Seated Hip Abduction with Resistance - 1 x daily - 5 x weekly - 3 sets - 10 reps Sit to Stand Without Arm Support - 1 x daily - 5 x weekly - 2 sets - 10 reps Seated Piriformis Stretch - 2 x daily - 7 x weekly - 1  sets - 3 reps - 30 hold   ASSESSMENT:  CLINICAL IMPRESSION: Pt able to progress L hip ABD and ER strengthening and tolerance to CKC loading at today's session. Given report of reduced pain, pt does appear to respond well to gentle loading, stretching, as well as deep ischemic pressure. Pt should be able to continue with hip ABD/ER strength and next session. Trial SL stability at next session. HEP updated at this time. Pt to see MD 2/1 for follow up appt. Patient should benefit from skilled PT to address above impairments and improve overall function.   Objective impairments include decreased activity  tolerance, decreased mobility, decreased strength, and pain. These impairments are limiting patient from occupation and workout activities . Personal factors including Time since onset of injury/illness/exacerbation are also affecting patient's functional outcome.    REHAB POTENTIAL: Good  CLINICAL DECISION MAKING: Stable/uncomplicated  EVALUATION COMPLEXITY: Low   GOALS:   SHORT TERM GOALS:  STG Name Target Date Goal status  1 Pt will be independent and compliant with HEP for improved pain, strength, and function.  Baseline:  05/23/2021 INITIAL  2 Pt will report at least a 25% improvement in pain and sx's overall  Baseline:  05/23/2021 INITIAL                            LONG TERM GOALS:   LTG Name Target Date Goal status  1 Pt will be able to sleep at least 5/7 nights/week without pain disturbing her. Baseline: 06/13/2021 INITIAL  2 Pt will demo improved core strength by progressing with core ex's without adverse effects and improved L hip strength to 5/5 MMT for improved tolerance with daily activities and improved tolerance with workout activities.  Baseline: 06/13/2021 INITIAL  3 Pt will report improved tolerance with workout activities to allow for pt to return to working out with her husband.  Baseline: 06/13/2021 INITIAL  4 Pt will report she is able to perform her normal work activities without significant pain Baseline: 06/13/2021 INITIAL                  PLAN: PT FREQUENCY: 1-2x/week  PT DURATION: 6 weeks  PLANNED INTERVENTIONS: Therapeutic exercises, Therapeutic activity, Neuro Muscular re-education, Patient/Family education, Joint mobilization, Stair training, Aquatic Therapy, Dry Needling, Electrical stimulation, Spinal mobilization, Cryotherapy, Moist heat, Taping, Traction, Ultrasound, and Manual therapy  PLAN FOR NEXT SESSION: MD note indicated stretching of her short external rotators as well as the hamstring in addition to a core strengthening program.     Daleen Bo PT, DPT 05/16/21 2:00 PM

## 2021-05-17 ENCOUNTER — Ambulatory Visit (HOSPITAL_BASED_OUTPATIENT_CLINIC_OR_DEPARTMENT_OTHER): Payer: BC Managed Care – PPO | Admitting: Orthopaedic Surgery

## 2021-05-17 DIAGNOSIS — M25852 Other specified joint disorders, left hip: Secondary | ICD-10-CM | POA: Diagnosis not present

## 2021-05-17 DIAGNOSIS — M25552 Pain in left hip: Secondary | ICD-10-CM | POA: Diagnosis not present

## 2021-05-17 MED ORDER — TRIAMCINOLONE ACETONIDE 40 MG/ML IJ SUSP
80.0000 mg | INTRAMUSCULAR | Status: AC | PRN
  Administered 2021-05-17: 80 mg via INTRA_ARTICULAR

## 2021-05-17 MED ORDER — LIDOCAINE HCL 1 % IJ SOLN
4.0000 mL | INTRAMUSCULAR | Status: AC | PRN
  Administered 2021-05-17: 4 mL

## 2021-05-17 NOTE — Progress Notes (Signed)
Chief Complaint: left hip pain     History of Present Illness:   05/17/2021 Jill Ashley presents today for follow-up of the left hip.  She has been working with physical therapy and does feel like this is dramatically improving her situation.  She has been able to work better at [employer]  Here today for further evaluation  Jill Ashley is a 56 y.o. female presents today with left hip pain since 2019 which she describes as deep gluteal pain.  She not have any specific injury or accident.  She works at [employer] and enjoys being very active.  She states that the pain occurs specifically when laying on the side.  The symptoms in the posterior aspect of the hip.  She has not had injections in the past.  She has had physical therapy in the past although this was aggravating her pain somewhat.  She does have a history of lower back pain but denies pain radiating down the leg.  She states that when she is standing she will flex at the knee in order to relieve her pain.  She takes ibuprofen and muscle relaxers which helps somewhat.    Surgical History:   None  PMH/PSH/Family History/Social History/Meds/Allergies:    Past Medical History:  Diagnosis Date   Urticaria    Past Surgical History:  Procedure Laterality Date   ADENOIDECTOMY     BREAST EXCISIONAL BIOPSY Left 2008   [place], [place]   TONSILLECTOMY     Social History   Socioeconomic History   Marital status: Married    Spouse name: Not on file   Number of children: Not on file   Years of education: Not on file   Highest education level: Not on file  Occupational History   Not on file  Tobacco Use   Smoking status: Former    Packs/day: 0.25    Years: 11.00    Pack years: 2.75    Types: Cigarettes    Quit date: 2006    Years since quitting: 17.0   Smokeless tobacco: Never  Vaping Use   Vaping Use: Never used  Substance and Sexual Activity   Alcohol use: Not on file    Drug use: Not on file   Sexual activity: Not on file  Other Topics Concern   Not on file  Social History Narrative   Not on file   Social Determinants of Health   Financial Resource Strain: Not on file  Food Insecurity: Not on file  Transportation Needs: Not on file  Physical Activity: Not on file  Stress: Not on file  Social Connections: Not on file   Family History  Problem Relation Age of Onset   Diabetes Father    Hypertension Father    Allergies  Allergen Reactions   Penicillins Anaphylaxis and Hives    At age 36   Morphine And Related Other (See Comments)    Hyperventilation, heart racing, nausea   Current Outpatient Medications  Medication Sig Dispense Refill   amLODipine (NORVASC) 5 MG tablet TAKE 1 TABLET BY MOUTH ONCE A DAY 90 tablet 2   amLODipine (NORVASC) 5 MG tablet TAKE 1 TABLET BY MOUTH ONCE DAILY 30 tablet 3   Ascorbic Acid (VITAMIN C PO) Take by mouth.     azelastine (ASTELIN) 0.1 % nasal spray 1  spray each nostril 1-2 times daily as needed 30 mL 5   BIOTIN PO Take by mouth.     Cholecalciferol (VITAMIN D3 PO) Take by mouth.     COVID-19 At Home Antigen Test [facility] COVID-19 HOME TEST) KIT Use as directed 4 each 0   Cyanocobalamin (VITAMIN B-12 PO) Take by mouth.     Estradiol-Progesterone (BIJUVA) 1-100 MG CAPS Take 1 capsule by mouth every evening. 90 capsule 4   estrogens, conjugated, (PREMARIN) 0.3 MG tablet Take 1 tablet (0.3 mg total) by mouth daily. 30 tablet 3   estrogens, conjugated, (PREMARIN) 0.45 MG tablet Take 1 tablet (0.45 mg total) by mouth daily. 30 tablet 3   estrogens, conjugated, (PREMARIN) 0.625 MG tablet Take 1 tablet (0.625 mg total) by mouth daily. 30 tablet 3   famotidine (PEPCID) 20 MG tablet Take 1 tablet (20 mg total) by mouth 2 (two) times daily as needed. 60 tablet 5   gabapentin (NEURONTIN) 100 MG capsule TAKE 1 CAPSULE BY MOUTH ONCE A DAY MAY TITRATE UP BY 100MG EACH WEEK UP TO 900MG 120 capsule 3    gabapentin (NEURONTIN) 300 MG capsule Take 1 capsule (300 mg total) by mouth 2 (two) times daily. 60 capsule 6   gabapentin (NEURONTIN) 300 MG capsule Take 1 capsule (300 mg total) by mouth 2 (two) times daily. 180 capsule 1   GNP EVENING PRIMROSE OIL PO Take by mouth.     guaiFENesin (MUCINEX) 600 MG 12 hr tablet For thick post nasal drainage take 1-2 tablets 2 times daily as needed, use adequate hydration. 60 tablet 5   influenza vac split quadrivalent PF (FLUARIX) 0.5 ML injection Inject into the muscle. 0.5 mL 0   levocetirizine (XYZAL) 5 MG tablet Take 1 tablet (5 mg total) by mouth daily as needed for allergies. 30 tablet 5   Norethindrone-Ethinyl Estradiol-Fe Biphas (LO LOESTRIN FE) 1 MG-10 MCG / 10 MCG tablet TAKE 1 TABLET BY MOUTH ONCE A DAY 28 tablet 12   progesterone (PROMETRIUM) 100 MG capsule Take 1 capsule (100 mg total) by mouth daily as directed 30 capsule 3   Rhubarb (ESTROVEN COMPLETE PO) Take by mouth.     traZODone (DESYREL) 50 MG tablet TAKE 1 TABLET BY MOUTH EVERY DAY AT BEDTIME AS NEEDED     traZODone (DESYREL) 50 MG tablet Take 1 tablet (50 mg total) by mouth at bedtime as needed. 90 tablet 3   UNABLE TO FIND Med Name: Christiana Fuchs supplement     venlafaxine (EFFEXOR) 37.5 MG tablet Take 1 tablet (37.5 mg total) by mouth daily with food 30 tablet 6   No current facility-administered medications for this visit.   No results found.  Review of Systems:   A ROS was performed including pertinent positives and negatives as documented in the HPI.  Physical Exam :   Constitutional: NAD and appears stated age Neurological: Alert and oriented Psych: Appropriate affect and cooperative There were no vitals taken for this visit.   Comprehensive Musculoskeletal Exam:    Inspection Right Left  Skin No atrophy or gross abnormalities appreciated No atrophy or gross abnormalities appreciated  Palpation    Tenderness None None  Crepitus None None  Range of Motion     Flexion (passive) 120 120  Extension 30 30  IR 30 30 some pain  ER 45 45 no pain  Strength    Flexion  5/5 5/5  Extension 5/5 5/5  Special Tests    FABIR Negative Negative  FADER Negative  Negative  ER Lag/Capsular Insufficiency Negative Negative  Instability Negative Negative  Sacroiliac pain Negative  Negative   Instability    Generalized Laxity No No  Neurologic    sciatic, femoral, obturator nerves intact to light sensation  Vascular/Lymphatic    DP pulse 2+ 2+  Lumbar Exam    Patient has symmetric lumbar range of motion with negative pain referral to hip  Full strength with abduction on the side.  No pain with resisted hamstring curl prone.    Imaging:   Xray (3 views left hip): Formal   I personally reviewed and interpreted the radiographs.   Assessment:   56 year old female with left deep posterior gluteal pain.  At today's visit this is more consistent with ischiofemoral impingement.  As result I have offered her an ultrasound-guided steroid injection into this area to help with physical therapy.  I did describe that ultimately stretching of the external rotators would provide her with the most optimal rehab.  She has been working on this significantly with Antony Haste and things are going quite well.  Plan :    -Plan for ultrasound-guided left posterior gluteal ischiofemoral injection after verbal consent was obtained     Procedure Note  Patient: Jill Ashley             Date of Birth: 03-Nov-1965           MRN: [id number]             Visit Date: 05/17/2021  Procedures: Visit Diagnoses: No diagnosis found.  Large Joint Inj: L hip joint on 05/17/2021 12:20 PM Indications: pain Details: 22 G 3.5 in needle, ultrasound-guided anterolateral approach  Arthrogram: No  Medications: 4 mL lidocaine 1 %; 80 mg triamcinolone acetonide 40 MG/ML Outcome: tolerated well, no immediate complications Procedure, treatment alternatives, risks and benefits explained, specific  risks discussed. Consent was given by the patient. Immediately prior to procedure a time out was called to verify the correct patient, procedure, equipment, support staff and site/side marked as required. Patient was prepped and draped in the usual sterile fashion.         I personally saw and evaluated the patient, and participated in the management and treatment plan.  Vanetta Mulders, MD Attending Physician, Orthopedic Surgery  This document was dictated using Dragon voice recognition software. A reasonable attempt at proof reading has been made to minimize errors.

## 2021-05-19 ENCOUNTER — Encounter (HOSPITAL_BASED_OUTPATIENT_CLINIC_OR_DEPARTMENT_OTHER): Payer: BC Managed Care – PPO | Admitting: Physical Therapy

## 2021-05-24 ENCOUNTER — Ambulatory Visit (HOSPITAL_BASED_OUTPATIENT_CLINIC_OR_DEPARTMENT_OTHER): Payer: BC Managed Care – PPO | Attending: Orthopaedic Surgery | Admitting: Physical Therapy

## 2021-05-24 ENCOUNTER — Encounter (HOSPITAL_BASED_OUTPATIENT_CLINIC_OR_DEPARTMENT_OTHER): Payer: Self-pay | Admitting: Physical Therapy

## 2021-05-24 ENCOUNTER — Other Ambulatory Visit: Payer: Self-pay

## 2021-05-24 DIAGNOSIS — M25552 Pain in left hip: Secondary | ICD-10-CM | POA: Insufficient documentation

## 2021-05-24 DIAGNOSIS — M6281 Muscle weakness (generalized): Secondary | ICD-10-CM | POA: Diagnosis not present

## 2021-05-24 NOTE — Therapy (Signed)
OUTPATIENT PHYSICAL TREATMENT   Patient Name: Jill Ashley MRN: [id number] DOB:01-05-1966, 56 y.o., female Today's Date: 05/24/2021   PT End of Session - 05/24/21 1352     Visit Number 4    Number of Visits 12    Date for PT Re-Evaluation 06/13/21    PT Start Time 0107    PT Stop Time 0148    PT Time Calculation (min) 41 min    Activity Tolerance Patient tolerated treatment well    Behavior During Therapy [facility] for tasks assessed/performed                Past Medical History:  Diagnosis Date   Urticaria    Past Surgical History:  Procedure Laterality Date   ADENOIDECTOMY     BREAST EXCISIONAL BIOPSY Left 2008   [place], [place]   TONSILLECTOMY     Patient Active Problem List   Diagnosis Date Noted   Recurrent urticaria 05/25/2019   Chronic rhinitis 05/25/2019    PCP: Pcp, No  REFERRING PROVIDER: Huel Cote, MD  REFERRING DIAG: M25.559 (ICD-10-CM) - Hip pain   THERAPY DIAG:  Pain in left hip  Muscle weakness (generalized)  ONSET DATE: Chronic / MD script 04/06/2021  SUBJECTIVE:   SUBJECTIVE STATEMENT: Pt states it's been up and down.  Pt states she had a flare up yesterday and had difficulty sleeping due to pain.  Pt reports no adverse effects after prior rx.  Pt reports she is somewhat compliant with HEP.  Pt has been using a short foam roller at home which she thinks it helps.  Pt reports she saw MD last week and received an US guided injection.  Pt states she felt better 2 days after the injection though her sx's have now returned to baseline.  Pt states she thinks the injection may have irritated her sx's.  Pt states she wasn't having the constant nagging pain in the hip as much prior to injection, but now it's back to where it was.  Pt states MD thinks her pain may be more due to ischiofemoral impingement.  Pt states she has difficulty sleeping due to pain.    PERTINENT HISTORY: Chronic hip pain, Hx of L knee pain  PAIN:  Are you having pain?  No NPRS scale: 6/10 Pain location: L glute and L HS/post thigh;  Pt also states she has a cold, burning pain under R glute Aggravating factors: lying on L side Relieving factors:    OCCUPATION: She works at [employer] and enjoys being very active.  Pt states she walks 15,000 steps per day  PLOF: Independent;   PATIENT GOALS improved sleeping; return to gym activities and be able to workout with husband   OBJECTIVE:   DIAGNOSTIC FINDINGS:  x ray which showed no acute bony abnormality.  small soft tissue calcifications adjacent to the greater trochanters bilaterally which may be related to old injury or chronic repetitive stress injury (In Epic)   TODAY'S TREATMENT: Therapeutic Exercise: -Reviewed response to prior Rx, HEP compliance, current function, and pain levles. -Pt performed: Hip ABD iso 5s 5x supine, blue TB Sidestepping with BTB above knees x 3 laps Bridge with blue band 20x Seated HS stretch 30s 3x  STS with blue hip ABD band 2x10 Piriformis stretch 30s 3x, pull towards  SLS x 20 and x 30 sec on floor and 2 x 20 sec on airex with occasional UE support.     Manual Therapy:   Pt received STM and rolling to L  post/lateral hip including glute max and glute med in R S/L'ing   PATIENT EDUCATION:  Education details: anatomy, exercise form, HEP, POC Person educated: Patient Education method: Explanation and visual, verbal and tactile cues Education comprehension: verbalized understanding and needs further education verbal and tactile cues required.   HOME EXERCISE PROGRAM: Access Code: [email or web address] URL: [email or web address] Date: 05/11/2021 Prepared by: Zebedee Iba  Exercises Seated Hip Abduction with Resistance - 1 x daily - 5 x weekly - 3 sets - 10 reps Sit to Stand Without Arm Support - 1 x daily - 5 x weekly - 2 sets - 10 reps Seated Piriformis Stretch - 2 x daily - 7 x weekly - 1 sets - 3 reps - 30 hold   ASSESSMENT:  CLINICAL IMPRESSION: Pt  states she saw MD and was informed he thinks her pain may be due to more ischiofemoral impingement.  Pt reports her sx's have been irritated since receiving the US guided injection.  Pt required cuing and instruction for correct form with exercises.  Pt was able to perform SLS on floor well without LOB without UEs and was more challenged on airex requiring intermittent UE support.  Pt responded well to Rx reporting improved pain from 6/10 before Rx to 4/10 after Rx.  Patient should benefit from skilled PT to address above impairments and improve overall function.      Objective impairments include decreased activity tolerance, decreased mobility, decreased strength, and pain. These impairments are limiting patient from occupation and workout activities . Personal factors including Time since onset of injury/illness/exacerbation are also affecting patient's functional outcome.    REHAB POTENTIAL: Good  CLINICAL DECISION MAKING: Stable/uncomplicated  EVALUATION COMPLEXITY: Low   GOALS:   SHORT TERM GOALS:  STG Name Target Date Goal status  1 Pt will be independent and compliant with HEP for improved pain, strength, and function.  Baseline:  05/23/2021 INITIAL  2 Pt will report at least a 25% improvement in pain and sx's overall  Baseline:  05/23/2021 INITIAL                            LONG TERM GOALS:   LTG Name Target Date Goal status  1 Pt will be able to sleep at least 5/7 nights/week without pain disturbing her. Baseline: 06/13/2021 INITIAL  2 Pt will demo improved core strength by progressing with core ex's without adverse effects and improved L hip strength to 5/5 MMT for improved tolerance with daily activities and improved tolerance with workout activities.  Baseline: 06/13/2021 INITIAL  3 Pt will report improved tolerance with workout activities to allow for pt to return to working out with her husband.  Baseline: 06/13/2021 INITIAL  4 Pt will report she is able to perform her  normal work activities without significant pain Baseline: 06/13/2021 INITIAL                  PLAN: PT FREQUENCY: 1-2x/week  PT DURATION: 6 weeks  PLANNED INTERVENTIONS: Therapeutic exercises, Therapeutic activity, Neuro Muscular re-education, Patient/Family education, Joint mobilization, Stair training, Aquatic Therapy, Dry Needling, Electrical stimulation, Spinal mobilization, Cryotherapy, Moist heat, Taping, Traction, Ultrasound, and Manual therapy  PLAN FOR NEXT SESSION: MD note indicated stretching of her short external rotators as well as the hamstring in addition to a core strengthening program.  Attempt S/L clams next visit.    Audie Clear III PT, DPT 05/24/21 4:46 PM

## 2021-05-26 ENCOUNTER — Encounter (HOSPITAL_BASED_OUTPATIENT_CLINIC_OR_DEPARTMENT_OTHER): Payer: BC Managed Care – PPO | Admitting: Physical Therapy

## 2021-05-30 ENCOUNTER — Other Ambulatory Visit: Payer: Self-pay

## 2021-05-30 ENCOUNTER — Ambulatory Visit (HOSPITAL_BASED_OUTPATIENT_CLINIC_OR_DEPARTMENT_OTHER): Payer: BC Managed Care – PPO | Admitting: Physical Therapy

## 2021-05-30 ENCOUNTER — Encounter (HOSPITAL_BASED_OUTPATIENT_CLINIC_OR_DEPARTMENT_OTHER): Payer: Self-pay | Admitting: Physical Therapy

## 2021-05-30 DIAGNOSIS — M6281 Muscle weakness (generalized): Secondary | ICD-10-CM

## 2021-05-30 DIAGNOSIS — M25552 Pain in left hip: Secondary | ICD-10-CM

## 2021-05-30 NOTE — Therapy (Signed)
OUTPATIENT PHYSICAL TREATMENT   Patient Name: Jill Ashley MRN: [id number] DOB:[birth year], 56 y.o., female Today's Date: 05/30/2021   PT End of Session - 05/30/21 1124     Visit Number 5    Number of Visits 12    Date for PT Re-Evaluation 06/13/21    PT Start Time 1106    PT Stop Time 1147    PT Time Calculation (min) 41 min    Activity Tolerance Patient tolerated treatment well    Behavior During Therapy [facility] for tasks assessed/performed                 Past Medical History:  Diagnosis Date   Urticaria    Past Surgical History:  Procedure Laterality Date   ADENOIDECTOMY     BREAST EXCISIONAL BIOPSY Left 2008   [place], [place]   TONSILLECTOMY     Patient Active Problem List   Diagnosis Date Noted   Recurrent urticaria 05/25/2019   Chronic rhinitis 05/25/2019    PCP: Pcp, No  REFERRING PROVIDER: Huel Cote, MD  REFERRING DIAG: M25.559 (ICD-10-CM) - Hip pain   THERAPY DIAG:  Pain in left hip  Muscle weakness (generalized)  ONSET DATE: Chronic / MD script 04/06/2021  SUBJECTIVE:   SUBJECTIVE STATEMENT: Pt states she is feeling better.  She thinks what was flared up after the injection has now calmed down.  Pt states she is sleeping better.  Pt reports no adverse effects after prior rx.  Pt reports she is compliant with HEP.  Pt states she has been rolling hip with foam roll and she has a roller.  Pt states MD thinks her pain may be more due to ischiofemoral impingement.          PERTINENT HISTORY: Chronic hip pain, Hx of L knee pain  PAIN:  Are you having pain? No NPRS scale: 0/10 Pain location: L glute and L HS/post thigh;  Pt also states she has a cold, burning pain under R glute Aggravating factors: lying on L side Relieving factors:    OCCUPATION: She works at [employer] and enjoys being very active.  Pt states she walks 15,000 steps per day  PLOF: Independent;   PATIENT GOALS improved sleeping; return to gym activities and be  able to workout with husband   OBJECTIVE:   DIAGNOSTIC FINDINGS:  x ray which showed no acute bony abnormality.  small soft tissue calcifications adjacent to the greater trochanters bilaterally which may be related to old injury or chronic repetitive stress injury (In Epic)   TODAY'S TREATMENT: Therapeutic Exercise: -Reviewed response to prior Rx, HEP compliance, current function, and pain levles. -Pt performed: Sidestepping with GTB below knees x 3 laps Bridge with blue band 20x Seated HS stretch 30s 3x  STS with blue hip ABD band 2x10 Piriformis stretch 30s 3x, pull towards  S/L clams 2x10 reps SLS 3 x 20 sec on airex with occasional UE support.            Cone taps x10 reps on table and 2x10 reps on 6 inch step Supine manual glute stretch 3x20-30 seconds    Manual Therapy:   Pt received STM and rolling to L post/lateral hip including glute max and glute med in R S/L'ing   PATIENT EDUCATION:  Education details: anatomy, exercise form, HEP, POC Person educated: Patient Education method: Explanation and visual, verbal and tactile cues Education comprehension: verbalized understanding and needs further education verbal and tactile cues required.   HOME EXERCISE PROGRAM: Access Code:  [email or web address] URL: [email or web address] Date: 05/11/2021 Prepared by: Zebedee Iba  Exercises Seated Hip Abduction with Resistance - 1 x daily - 5 x weekly - 3 sets - 10 reps Sit to Stand Without Arm Support - 1 x daily - 5 x weekly - 2 sets - 10 reps Seated Piriformis Stretch - 2 x daily - 7 x weekly - 1 sets - 3 reps - 30 hold   ASSESSMENT:  CLINICAL IMPRESSION: Pt presents to Rx reporting improved sx's and denies pain currently.  Pt has improved soft tissue tightness in L glute.  Pt performed exercises well with verbal and visual cuing for correct form and positioning.  She demonstrated good control with cone taps and improved control with SLS on airex.  Pt responded well to Rx  reporting no pain after Rx though could tell she worked it out.  Patient should benefit from skilled PT to address above impairments and improve overall function.           Objective impairments include decreased activity tolerance, decreased mobility, decreased strength, and pain. These impairments are limiting patient from occupation and workout activities . Personal factors including Time since onset of injury/illness/exacerbation are also affecting patient's functional outcome.    REHAB POTENTIAL: Good  CLINICAL DECISION MAKING: Stable/uncomplicated  EVALUATION COMPLEXITY: Low   GOALS:   SHORT TERM GOALS:  STG Name Target Date Goal status  1 Pt will be independent and compliant with HEP for improved pain, strength, and function.  Baseline:  05/23/2021 INITIAL  2 Pt will report at least a 25% improvement in pain and sx's overall  Baseline:  05/23/2021 INITIAL                            LONG TERM GOALS:   LTG Name Target Date Goal status  1 Pt will be able to sleep at least 5/7 nights/week without pain disturbing her. Baseline: 06/13/2021 INITIAL  2 Pt will demo improved core strength by progressing with core ex's without adverse effects and improved L hip strength to 5/5 MMT for improved tolerance with daily activities and improved tolerance with workout activities.  Baseline: 06/13/2021 INITIAL  3 Pt will report improved tolerance with workout activities to allow for pt to return to working out with her husband.  Baseline: 06/13/2021 INITIAL  4 Pt will report she is able to perform her normal work activities without significant pain Baseline: 06/13/2021 INITIAL                  PLAN: PT FREQUENCY: 1-2x/week  PT DURATION: 6 weeks  PLANNED INTERVENTIONS: Therapeutic exercises, Therapeutic activity, Neuro Muscular re-education, Patient/Family education, Joint mobilization, Stair training, Aquatic Therapy, Dry Needling, Electrical stimulation, Spinal mobilization,  Cryotherapy, Moist heat, Taping, Traction, Ultrasound, and Manual therapy  PLAN FOR NEXT SESSION: MD note indicated stretching of her short external rotators as well as the hamstring in addition to a core strengthening program.  Attempt S/L clams next visit.    Audie Clear III PT, DPT 05/30/21 11:58 AM

## 2021-06-02 ENCOUNTER — Encounter (HOSPITAL_BASED_OUTPATIENT_CLINIC_OR_DEPARTMENT_OTHER): Payer: BC Managed Care – PPO | Admitting: Physical Therapy

## 2021-06-06 ENCOUNTER — Ambulatory Visit (HOSPITAL_BASED_OUTPATIENT_CLINIC_OR_DEPARTMENT_OTHER): Payer: BC Managed Care – PPO | Admitting: Physical Therapy

## 2021-06-06 ENCOUNTER — Other Ambulatory Visit: Payer: Self-pay

## 2021-06-06 ENCOUNTER — Encounter (HOSPITAL_BASED_OUTPATIENT_CLINIC_OR_DEPARTMENT_OTHER): Payer: Self-pay | Admitting: Physical Therapy

## 2021-06-06 DIAGNOSIS — M6281 Muscle weakness (generalized): Secondary | ICD-10-CM | POA: Diagnosis not present

## 2021-06-06 DIAGNOSIS — M25552 Pain in left hip: Secondary | ICD-10-CM

## 2021-06-06 NOTE — Therapy (Signed)
OUTPATIENT PHYSICAL TREATMENT   Patient Name: Jill Ashley MRN: [id number] DOB:1965-07-23, 56 y.o., female Today's Date: 06/06/2021   PT End of Session - 06/06/21 0920     Visit Number 6    Number of Visits 12    Date for PT Re-Evaluation 06/13/21    PT Start Time 0845    PT Stop Time 0925    PT Time Calculation (min) 40 min    Activity Tolerance Patient tolerated treatment well    Behavior During Therapy [facility] for tasks assessed/performed                  Past Medical History:  Diagnosis Date   Urticaria    Past Surgical History:  Procedure Laterality Date   ADENOIDECTOMY     BREAST EXCISIONAL BIOPSY Left 2008   [place], [place]   TONSILLECTOMY     Patient Active Problem List   Diagnosis Date Noted   Recurrent urticaria 05/25/2019   Chronic rhinitis 05/25/2019    PCP: Pcp, No  REFERRING PROVIDER: Vanetta Mulders, MD  REFERRING DIAG: M25.559 (ICD-10-CM) - Hip pain   THERAPY DIAG:  Pain in left hip  Muscle weakness (generalized)  ONSET DATE: Chronic / MD script 04/06/2021  SUBJECTIVE:   SUBJECTIVE STATEMENT:  Pt reports she started having more L hip and groin pain since last session. She is still able to sleep on that hip but having more pain at rest. Pt will feel it when bending over to brush teeth as well. Pt has not had increased activity/changes in levels of activity.       PERTINENT HISTORY: Chronic hip pain, Hx of L knee pain  PAIN:  Are you having pain? No NPRS scale: 0/10 Pain location: L glute and L HS/post thigh;  Pt also states she has a cold, burning pain under R glute Aggravating factors: lying on L side Relieving factors:    OCCUPATION: She works at [employer] and enjoys being very active.  Pt states she walks 15,000 steps per day  PLOF: Independent;   PATIENT GOALS improved sleeping; return to gym activities and be able to workout with husband   OBJECTIVE:   DIAGNOSTIC FINDINGS:  x ray which showed no acute bony  abnormality.  small soft tissue calcifications adjacent to the greater trochanters bilaterally which may be related to old injury or chronic repetitive stress injury (In Epic)  FOTO 6th visit 2/21 72  R innominate ant rotation in supine      TODAY'S TREATMENT:  Therapeutic Exercise: -Pt performed: Kneeling hip flexor stretch 30s 2x (stopped due to HS irritation)   Piriformis stretch 30s 3x, leaning forward    Seated QL stretch 30s 3x STS with blue hip ABD band 2x10    Manual Therapy:   -Mulligan lateral and inf L hip grade III belt mob   -SIJ shotgun MET with R innominate ant rotation MET  PATIENT EDUCATION:  Education details: anatomy, exercise form, HEP, POC Person educated: Patient Education method: Explanation and visual, verbal and tactile cues Education comprehension: verbalized understanding and needs further education verbal and tactile cues required.   HOME EXERCISE PROGRAM: Access Code: [email or web address] URL: [email or web address] Date: 06/06/2021 Prepared by: Daleen Bo  Exercises Side Stepping with Resistance at Thighs - 1 x daily - 5 x weekly - 1 sets - 2 reps - 4f hold Supine Bridge with Resistance Band - 1 x daily - 5 x weekly - 3 sets - 10 reps Sit to Stand Without Arm Support - 1  x daily - 5 x weekly - 2 sets - 10 reps Standing Wrist Flexion Stretch - 2 x daily - 7 x weekly - 1 sets - 3 reps - 30 hold Standing Wrist Extension Stretch - 2 x daily - 7 x weekly - 1 sets - 3 reps - 30 hold Seated Piriformis Stretch - 2 x daily - 7 x weekly - 1 sets - 3 reps - 30 hold Piriformis Mobilization on Foam Roll - 1 x daily - 7 x weekly - 1 sets - 1 reps - 2-3 min hold Quadriceps Mobilization with Foam Roll - 1 x daily - 7 x weekly - 1 sets - 1 reps - 2-3 hold    ASSESSMENT:  CLINICAL IMPRESSION: Pt presents to session today with report of increase ant groin and post lateral L hip pain between sessions. Pt does presents with innominate rotation that was  improved with SIJ MET. Pt had abolishment of discomfort into L groin and hip by end of session. Pt responded well to addition of QL stretch and flexion based hip stretching. Report of posterior hip burning during hip and lumbar extension suggest potential for lumbar related contribution. Plan to re-assess response to today's treatment session and load lumbopelvic stability as tolerated. Repeat mob and MET PRN. Patient should benefit from skilled PT to address above impairments and improve overall function.           Objective impairments include decreased activity tolerance, decreased mobility, decreased strength, and pain. These impairments are limiting patient from occupation and workout activities . Personal factors including Time since onset of injury/illness/exacerbation are also affecting patient's functional outcome.    REHAB POTENTIAL: Good  CLINICAL DECISION MAKING: Stable/uncomplicated  EVALUATION COMPLEXITY: Low   GOALS:   SHORT TERM GOALS:  STG Name Target Date Goal status  1 Pt will be independent and compliant with HEP for improved pain, strength, and function.  Baseline:  05/23/2021 INITIAL  2 Pt will report at least a 25% improvement in pain and sx's overall  Baseline:  05/23/2021 INITIAL                            LONG TERM GOALS:   LTG Name Target Date Goal status  1 Pt will be able to sleep at least 5/7 nights/week without pain disturbing her. Baseline: 06/13/2021 INITIAL  2 Pt will demo improved core strength by progressing with core ex's without adverse effects and improved L hip strength to 5/5 MMT for improved tolerance with daily activities and improved tolerance with workout activities.  Baseline: 06/13/2021 INITIAL  3 Pt will report improved tolerance with workout activities to allow for pt to return to working out with her husband.  Baseline: 06/13/2021 INITIAL  4 Pt will report she is able to perform her normal work activities without significant  pain Baseline: 06/13/2021 INITIAL                  PLAN: PT FREQUENCY: 1-2x/week  PT DURATION: 6 weeks  PLANNED INTERVENTIONS: Therapeutic exercises, Therapeutic activity, Neuro Muscular re-education, Patient/Family education, Joint mobilization, Stair training, Aquatic Therapy, Dry Needling, Electrical stimulation, Spinal mobilization, Cryotherapy, Moist heat, Taping, Traction, Ultrasound, and Manual therapy  PLAN FOR NEXT SESSION: MD note indicated stretching of her short external rotators as well as the hamstring in addition to a core strengthening program.    Daleen Bo PT, DPT 06/06/21 9:33 AM

## 2021-06-08 ENCOUNTER — Ambulatory Visit (HOSPITAL_BASED_OUTPATIENT_CLINIC_OR_DEPARTMENT_OTHER): Payer: BC Managed Care – PPO | Admitting: Physical Therapy

## 2021-06-08 ENCOUNTER — Encounter (HOSPITAL_BASED_OUTPATIENT_CLINIC_OR_DEPARTMENT_OTHER): Payer: Self-pay

## 2021-06-13 ENCOUNTER — Ambulatory Visit (HOSPITAL_BASED_OUTPATIENT_CLINIC_OR_DEPARTMENT_OTHER): Payer: BC Managed Care – PPO | Admitting: Physical Therapy

## 2021-06-13 NOTE — Therapy (Incomplete)
OUTPATIENT PHYSICAL TREATMENT   Patient Name: Jill Ashley MRN: [id number] DOB:08-27-1965, 56 y.o., female Today's Date: 06/13/2021          Past Medical History:  Diagnosis Date   Urticaria    Past Surgical History:  Procedure Laterality Date   ADENOIDECTOMY     BREAST EXCISIONAL BIOPSY Left 2008   [place], [place]   TONSILLECTOMY     Patient Active Problem List   Diagnosis Date Noted   Recurrent urticaria 05/25/2019   Chronic rhinitis 05/25/2019    PCP: Pcp, No  REFERRING PROVIDER: Vanetta Mulders, MD  REFERRING DIAG: M25.559 (ICD-10-CM) - Hip pain   THERAPY DIAG:  No diagnosis found.  ONSET DATE: Chronic / MD script 04/06/2021  SUBJECTIVE:   SUBJECTIVE STATEMENT:  Pt reports she started having more L hip and groin pain since last session. She is still able to sleep on that hip but having more pain at rest. Pt will feel it when bending over to brush teeth as well. Pt has not had increased activity/changes in levels of activity.       PERTINENT HISTORY: Chronic hip pain, Hx of L knee pain  PAIN:  Are you having pain? No NPRS scale: 0/10 Pain location: L glute and L HS/post thigh;  Pt also states she has a cold, burning pain under R glute Aggravating factors: lying on L side Relieving factors:    OCCUPATION: She works at [employer] and enjoys being very active.  Pt states she walks 15,000 steps per day  PLOF: Independent;   PATIENT GOALS improved sleeping; return to gym activities and be able to workout with husband   OBJECTIVE:   DIAGNOSTIC FINDINGS:  x ray which showed no acute bony abnormality.  small soft tissue calcifications adjacent to the greater trochanters bilaterally which may be related to old injury or chronic repetitive stress injury (In Epic)  FOTO 6th visit 2/21 72  R innominate ant rotation in supine      TODAY'S TREATMENT:  Therapeutic Exercise: -Pt performed: Kneeling hip flexor stretch 30s 2x (stopped due to  HS irritation)   Piriformis stretch 30s 3x, leaning forward    Seated QL stretch 30s 3x STS with blue hip ABD band 2x10    Manual Therapy:   -Mulligan lateral and inf L hip grade III belt mob   -SIJ shotgun MET with R innominate ant rotation MET  PATIENT EDUCATION:  Education details: anatomy, exercise form, HEP, POC Person educated: Patient Education method: Explanation and visual, verbal and tactile cues Education comprehension: verbalized understanding and needs further education verbal and tactile cues required.   HOME EXERCISE PROGRAM: Access Code: [email or web address] URL: [email or web address] Date: 06/06/2021 Prepared by: Daleen Bo  Exercises Side Stepping with Resistance at Thighs - 1 x daily - 5 x weekly - 1 sets - 2 reps - 21f hold Supine Bridge with Resistance Band - 1 x daily - 5 x weekly - 3 sets - 10 reps Sit to Stand Without Arm Support - 1 x daily - 5 x weekly - 2 sets - 10 reps Standing Wrist Flexion Stretch - 2 x daily - 7 x weekly - 1 sets - 3 reps - 30 hold Standing Wrist Extension Stretch - 2 x daily - 7 x weekly - 1 sets - 3 reps - 30 hold Seated Piriformis Stretch - 2 x daily - 7 x weekly - 1 sets - 3 reps - 30 hold Piriformis Mobilization on Foam Roll - 1 x daily - 7 x  weekly - 1 sets - 1 reps - 2-3 min hold Quadriceps Mobilization with Foam Roll - 1 x daily - 7 x weekly - 1 sets - 1 reps - 2-3 hold    ASSESSMENT:  CLINICAL IMPRESSION: Pt presents to session today with report of increase ant groin and post lateral L hip pain between sessions. Pt does presents with innominate rotation that was improved with SIJ MET. Pt had abolishment of discomfort into L groin and hip by end of session. Pt responded well to addition of QL stretch and flexion based hip stretching. Report of posterior hip burning during hip and lumbar extension suggest potential for lumbar related contribution. Plan to re-assess response to today's treatment session and load lumbopelvic  stability as tolerated. Repeat mob and MET PRN. Patient should benefit from skilled PT to address above impairments and improve overall function.           Objective impairments include decreased activity tolerance, decreased mobility, decreased strength, and pain. These impairments are limiting patient from occupation and workout activities . Personal factors including Time since onset of injury/illness/exacerbation are also affecting patient's functional outcome.    REHAB POTENTIAL: Good  CLINICAL DECISION MAKING: Stable/uncomplicated  EVALUATION COMPLEXITY: Low   GOALS:   SHORT TERM GOALS:  STG Name Target Date Goal status  1 Pt will be independent and compliant with HEP for improved pain, strength, and function.  Baseline:  05/23/2021 INITIAL  2 Pt will report at least a 25% improvement in pain and sx's overall  Baseline:  05/23/2021 INITIAL                            LONG TERM GOALS:   LTG Name Target Date Goal status  1 Pt will be able to sleep at least 5/7 nights/week without pain disturbing her. Baseline: 06/13/2021 INITIAL  2 Pt will demo improved core strength by progressing with core ex's without adverse effects and improved L hip strength to 5/5 MMT for improved tolerance with daily activities and improved tolerance with workout activities.  Baseline: 06/13/2021 INITIAL  3 Pt will report improved tolerance with workout activities to allow for pt to return to working out with her husband.  Baseline: 06/13/2021 INITIAL  4 Pt will report she is able to perform her normal work activities without significant pain Baseline: 06/13/2021 INITIAL                  PLAN: PT FREQUENCY: 1-2x/week  PT DURATION: 6 weeks  PLANNED INTERVENTIONS: Therapeutic exercises, Therapeutic activity, Neuro Muscular re-education, Patient/Family education, Joint mobilization, Stair training, Aquatic Therapy, Dry Needling, Electrical stimulation, Spinal mobilization, Cryotherapy, Moist heat,  Taping, Traction, Ultrasound, and Manual therapy  PLAN FOR NEXT SESSION: MD note indicated stretching of her short external rotators as well as the hamstring in addition to a core strengthening program.    Daleen Bo PT, DPT 06/13/21 2:44 PM

## 2021-06-14 DIAGNOSIS — I10 Essential (primary) hypertension: Secondary | ICD-10-CM | POA: Diagnosis not present

## 2021-06-14 DIAGNOSIS — Z0000 Encounter for general adult medical examination without abnormal findings: Secondary | ICD-10-CM | POA: Diagnosis not present

## 2021-06-14 DIAGNOSIS — E7800 Pure hypercholesterolemia, unspecified: Secondary | ICD-10-CM | POA: Diagnosis not present

## 2021-06-14 DIAGNOSIS — R000 Tachycardia, unspecified: Secondary | ICD-10-CM | POA: Diagnosis not present

## 2021-06-14 DIAGNOSIS — R002 Palpitations: Secondary | ICD-10-CM | POA: Diagnosis not present

## 2021-06-15 ENCOUNTER — Ambulatory Visit (HOSPITAL_BASED_OUTPATIENT_CLINIC_OR_DEPARTMENT_OTHER): Payer: BC Managed Care – PPO | Admitting: Physical Therapy

## 2021-06-15 ENCOUNTER — Encounter (HOSPITAL_BASED_OUTPATIENT_CLINIC_OR_DEPARTMENT_OTHER): Payer: Self-pay

## 2021-06-28 ENCOUNTER — Ambulatory Visit (HOSPITAL_BASED_OUTPATIENT_CLINIC_OR_DEPARTMENT_OTHER): Payer: BC Managed Care – PPO | Admitting: Orthopaedic Surgery

## 2021-07-19 ENCOUNTER — Encounter (HOSPITAL_BASED_OUTPATIENT_CLINIC_OR_DEPARTMENT_OTHER): Payer: Self-pay | Admitting: Physical Therapy

## 2021-07-19 ENCOUNTER — Other Ambulatory Visit (HOSPITAL_BASED_OUTPATIENT_CLINIC_OR_DEPARTMENT_OTHER): Payer: Self-pay

## 2021-07-19 ENCOUNTER — Ambulatory Visit (HOSPITAL_BASED_OUTPATIENT_CLINIC_OR_DEPARTMENT_OTHER): Payer: BC Managed Care – PPO | Attending: Orthopaedic Surgery | Admitting: Physical Therapy

## 2021-07-19 DIAGNOSIS — M25552 Pain in left hip: Secondary | ICD-10-CM | POA: Diagnosis not present

## 2021-07-19 DIAGNOSIS — M6281 Muscle weakness (generalized): Secondary | ICD-10-CM | POA: Diagnosis not present

## 2021-07-19 MED ORDER — ZOSTER VAC RECOMB ADJUVANTED 50 MCG/0.5ML IM SUSR
INTRAMUSCULAR | 0 refills | Status: DC
  Filled 2021-07-19: qty 0.5, 1d supply, fill #0

## 2021-07-19 NOTE — Therapy (Signed)
?OUTPATIENT PHYSICAL RE-CERTIFICATION NOTE ? ? ?Patient Name: Jill Ashley ?MRN: [id number] ?DOB:1966/01/20, 56 y.o., female ?Today's Date: 07/19/2021 ? ? PT End of Session - 07/19/21 1147   ? ? Visit Number 7   ? Number of Visits 12   ? Date for PT Re-Evaluation 06/13/21   ? PT Start Time 1100   ? PT Stop Time 1140   ? PT Time Calculation (min) 40 min   ? Activity Tolerance Patient tolerated treatment well   ? Behavior During Therapy [facility] for tasks assessed/performed   ? ?  ?  ? ?  ? ? ? ? ? ? ? ? ?Past Medical History:  ?Diagnosis Date  ? Urticaria   ? ?Past Surgical History:  ?Procedure Laterality Date  ? ADENOIDECTOMY    ? BREAST EXCISIONAL BIOPSY Left 2008  ? [address], [place]  ? TONSILLECTOMY    ? ?Patient Active Problem List  ? Diagnosis Date Noted  ? Recurrent urticaria 05/25/2019  ? Chronic rhinitis 05/25/2019  ? ? ?PCP: Pcp, No ? ?REFERRING PROVIDER: Vanetta Mulders, MD ? ?REFERRING DIAG: M25.559 (ICD-10-CM) - Hip pain  ? ?THERAPY DIAG:  ?Pain in left hip - Plan: PT plan of care cert/re-cert ? ?Muscle weakness (generalized) - Plan: PT plan of care cert/re-cert ? ?ONSET DATE: Chronic / MD script 04/06/2021 ? ?SUBJECTIVE:  ? ?SUBJECTIVE STATEMENT: ? ?Pt states she is starting yoga now. She feels better now that she is doing some stretching as well as doing some of the HEP on and off. Pt states the pain at the highest is a 9/10 when lifting heavy loads. She has not had any issues otherwise. Her groin pain is minimized.  ? ?Pt returns to clinic today following long gap due to personal scheduling and clinic schedule conflicts. ? ?    ?PERTINENT HISTORY: ?Chronic hip pain, Hx of L knee pain ? ?PAIN:  ?Are you having pain? No ?NPRS scale: 0/10 ?Pain location: L glute and L HS/post thigh;  Pt also states she has a cold, burning pain under R glute ?Aggravating factors: lying on L side ?Relieving factors:  ? ? ?OCCUPATION: She works at [employer] and enjoys being very active.  Pt states she walks 15,000 steps per  day ? ?PLOF: Independent;  ? ?PATIENT GOALS improved sleeping; return to gym activities and be able to workout with husband ? ? ?OBJECTIVE:  ? ?DIAGNOSTIC FINDINGS:  x ray which showed no acute bony abnormality.  small soft tissue calcifications adjacent ?to the greater trochanters bilaterally which may be related to old injury or chronic repetitive stress injury (In Epic) ? ?FOTO 6th visit 2/21 72 ? ?FOTO 7th visit 70pts ? ? ?PALPATION: hypertonicity and TTP of rec fem, TFL, and VL ? ?  ?LE AROM/PROM: ?  ?A/PROM Right ?05/03/2021 Left ?05/03/2021 L ? ?4/5  ?Hip flexion   117 WNL  ?Hip extension 11 11 10   ?Hip abduction [facility] WFL WNL  ?Hip adduction WNL WNL WNL  ?Hip internal rotation 28 29 25  p! In groin   ?Hip external rotation 32 31 35 groin and lateral hip pain  ? (Blank rows = not tested) ?  ?LE MMT: ?  ?MMT Right ?05/03/2021 Left ?05/03/2021 L 4/5  ?Hip flexion 5/5 4/5 4+/5 p! In groin  ?Hip extension 5/5 4/5 4+/5  ?Hip abduction 5/5 5/5 5/5  ?Hip adduction 5/5 4/5 5/5  ?Hip internal rotation 5/5 5/5 5/5 p! In groin  ?Hip external rotation 5/5 4+/5 4+/5  ? (Blank rows =  not tested) ? ? ? ?TODAY'S TREATMENT: ? ?Therapeutic Exercise: ?-Pt performed: ?Supine modified thomas stretch 30s 2x ?Prone quad stretch 30s 2x ?Standing adduction stretch in hip ext 30s 3x ?Self quad STM ? ?  Manual Therapy: ?  STM quad  ? ?PATIENT EDUCATION:  ?Education details: exam findings, deficits, exercise progression, focus on mobility anatomy, exercise form, HEP, POC ?Person educated: Patient ?Education method: Explanation and visual, verbal and tactile cues ?Education comprehension: verbalized understanding and needs further education verbal and tactile cues required. ? ? ?HOME EXERCISE PROGRAM: ?Access Code: [email or web address] ?URL: [email or web address] ?Date: 07/19/2021 ?Prepared by: Daleen Bo ? ?Exercises ?- Side Stepping with Resistance at Thighs  - 1 x daily - 5 x weekly - 1 sets - 2 reps - 81f hold ?- Seated Piriformis Stretch   - 2 x daily - 7 x weekly - 1 sets - 3 reps - 30 hold ?- Piriformis Mobilization on Foam Roll  - 1 x daily - 7 x weekly - 1 sets - 1 reps - 2-3 min hold ?- Supine Quadriceps Stretch with Strap on Table  - 1 x daily - 7 x weekly - 1 sets - 3 reps - 30 hold ?- Prone Quadriceps Stretch with Strap  - 1 x daily - 7 x weekly - 1 sets - 3 reps - 30 hold ? ? ? ?ASSESSMENT: ? ?CLINICAL IMPRESSION: ?Pt return to clinic following extended break from therapy. Pt's postero-lateral hip pain has nearly resolved at this point but pt does have increased groin and anterior hip pain that appears consistent with potential impingement type pain. Pt had relief of discomfort with STM as well as stretching exercise. Pt to continue with therapy at originally prescribed frequency. Pt's largest deficits at this point are joint mobility, soft tissue restriction, and pain.  Patient should benefit from skilled PT to address above impairments and improve overall function.    ? ?    ? ? Objective impairments include decreased activity tolerance, decreased mobility, decreased strength, and pain. These impairments are limiting patient from occupation and workout activities . Personal factors including Time since onset of injury/illness/exacerbation are also affecting patient's functional outcome.  ? ? ?REHAB POTENTIAL: Good ? ?CLINICAL DECISION MAKING: Stable/uncomplicated ? ?EVALUATION COMPLEXITY: Low ? ? ?GOALS: ? ? ?SHORT TERM GOALS: ? ?STG Name Target Date Goal status  ?1 Pt will be independent and compliant with HEP for improved pain, strength, and function.  ?Baseline:  08/30/2021  MET  ?2 Pt will report at least a 25% improvement in pain and sx's overall  ?Baseline:  08/30/2021  MET  ?     ?     ?     ?     ?     ? ?LONG TERM GOALS:  ? ?LTG Name Target Date Goal status  ?1 Pt will be able to sleep at least 5/7 nights/week without pain disturbing her. ?Baseline: 10/11/2021  ongoing  ?2 Pt will demo improved core strength by progressing with core  ex's without adverse effects and improved L hip strength to 5/5 MMT for improved tolerance with daily activities and improved tolerance with workout activities.  ?Baseline: 10/11/2021  ongoing  ?3 Pt will report improved tolerance with workout activities to allow for pt to return to working out with her husband.  ?Baseline: 10/11/2021  ongoing  ?4 Pt will report she is able to perform her normal work activities without significant pain ?Baseline: 10/11/2021  ongoing  ?     ?     ?     ? ?  PLAN: ?PT FREQUENCY: 1-2x/week ? ?PT DURATION: 8 weeks ? ?PLANNED INTERVENTIONS: Therapeutic exercises, Therapeutic activity, Neuro Muscular re-education, Patient/Family education, Joint mobilization, Stair training, Aquatic Therapy, Dry Needling, Electrical stimulation, Spinal mobilization, Cryotherapy, Moist heat, Taping, Traction, Ultrasound, and Manual therapy ? ?PLAN FOR NEXT SESSION: focus on L hip mobility, flexibility, and hip flexor strength ? ?Daleen Bo PT, DPT ?07/19/21 12:09 PM ? ? ? ? ? ? ?

## 2021-07-20 ENCOUNTER — Other Ambulatory Visit (HOSPITAL_BASED_OUTPATIENT_CLINIC_OR_DEPARTMENT_OTHER): Payer: Self-pay

## 2021-07-25 ENCOUNTER — Other Ambulatory Visit (HOSPITAL_BASED_OUTPATIENT_CLINIC_OR_DEPARTMENT_OTHER): Payer: Self-pay

## 2021-07-25 ENCOUNTER — Ambulatory Visit (HOSPITAL_BASED_OUTPATIENT_CLINIC_OR_DEPARTMENT_OTHER): Payer: BC Managed Care – PPO | Admitting: Physical Therapy

## 2021-07-25 ENCOUNTER — Encounter (HOSPITAL_BASED_OUTPATIENT_CLINIC_OR_DEPARTMENT_OTHER): Payer: Self-pay | Admitting: Physical Therapy

## 2021-07-25 DIAGNOSIS — M25552 Pain in left hip: Secondary | ICD-10-CM

## 2021-07-25 DIAGNOSIS — M6281 Muscle weakness (generalized): Secondary | ICD-10-CM

## 2021-07-25 NOTE — Therapy (Signed)
?OUTPATIENT PHYSICAL THERAPY TREATMENT NOTE ? ? ?Patient Name: Jill Ashley ?MRN: [id number] ?DOB:[birth year], 56 y.o., female ?Today's Date: 07/25/2021 ? ? PT End of Session - 07/25/21 1147   ? ? Visit Number 8   ? Number of Visits 12   ? Date for PT Re-Evaluation 10/17/21   ? PT Start Time 1111   ? PT Stop Time 1151   ? PT Time Calculation (min) 40 min   ? Activity Tolerance Patient tolerated treatment well   ? Behavior During Therapy [facility] for tasks assessed/performed   ? ?  ?  ? ?  ? ? ? ? ? ? ? ? ? ?Past Medical History:  ?Diagnosis Date  ? Urticaria   ? ?Past Surgical History:  ?Procedure Laterality Date  ? ADENOIDECTOMY    ? BREAST EXCISIONAL BIOPSY Left 2008  ? [address], [place]  ? TONSILLECTOMY    ? ?Patient Active Problem List  ? Diagnosis Date Noted  ? Recurrent urticaria 05/25/2019  ? Chronic rhinitis 05/25/2019  ? ? ?PCP: Pcp, No ? ?REFERRING PROVIDER: Vanetta Mulders, MD ? ?REFERRING DIAG: M25.559 (ICD-10-CM) - Hip pain  ? ?THERAPY DIAG:  ?Pain in left hip ? ?Muscle weakness (generalized) ? ?ONSET DATE: Chronic / MD script 04/06/2021 ? ?SUBJECTIVE:  ? ?SUBJECTIVE STATEMENT: ? ?Pt states the pain at the highest is a 9/10 when lifting heavy loads. ?Pt returns to clinic today following long gap due to personal scheduling, her father's medical issues, and clinic schedule conflicts. ? ?Pt reports she has been performing yoga once per week for the past 4 weeks.  Pt states she was feeling good with yoga but her hip was angry after yesterday's yoga session.  Pt states she did some new exercises in a yoga which involved hip opening.  Pt states her groin feels better.  Pt states she felt fantastic after prior Rx.  Pt reports she has been compliant with HEP and feels much better since the stretching.  Pt reports she is not having the popping in her hip.  Pt reports improved groin pain.  ? ?    ?PERTINENT HISTORY: ?Chronic hip pain, Hx of L knee pain ? ?PAIN:  ?Are you having pain? No ?NPRS scale: 6-7/10 ?Pain  location: L glute and L post hip;  Pt also states she has a cold, burning pain under R glute ?Aggravating factors: lying on L side ?Relieving factors:  ? ? ?OCCUPATION: She works at [employer] and enjoys being very active.  Pt states she walks 15,000 steps per day ? ?PLOF: Independent;  ? ?PATIENT GOALS improved sleeping; return to gym activities and be able to workout with husband ? ? ?OBJECTIVE:  ? ?DIAGNOSTIC FINDINGS:  x ray which showed no acute bony abnormality.  small soft tissue calcifications adjacent ?to the greater trochanters bilaterally which may be related to old injury or chronic repetitive stress injury (In Epic) ? ?TODAY'S TREATMENT: ? ?Therapeutic Exercise: ?-Reviewed pt presentation, HEP compliance, response to prior Rx, and pain level. ?-Pt performed: ?Supine modified thomas stretch 30s 2x ?Prone quad stretch 30s 3x ?Supine piriformis stretch x 30 sec and seated piriformis stretch 2x30 sec ?Standing adduction stretch in hip ext 30s 2x bilat ?Lateral band walks 2x10 with GTB ?Monster Walks with GTB 2x10 reps ?SLS on airex x 20 sec x 3 reps with frequent UE assist ?Bridge with blue band 20x ? ?  Manual Therapy: ?    Pt received STM and rolling to L post/lateral hip including glute max in R  S/L'ing with pillow b/w knees.  Pt received rolling to L quad  ? ?PATIENT EDUCATION:  ?Education details: exercise form, HEP, POC. ?Person educated: Patient ?Education method: Explanation and visual, verbal and tactile cues ?Education comprehension: verbalized understanding and needs further education verbal and tactile cues required. ? ? ?HOME EXERCISE PROGRAM: ?Access Code: [email or web address] ?URL: [email or web address] ?Date: 07/19/2021 ?Prepared by: Daleen Bo ? ?Exercises ?- Side Stepping with Resistance at Thighs  - 1 x daily - 5 x weekly - 1 sets - 2 reps - 27f hold ?- Seated Piriformis Stretch  - 2 x daily - 7 x weekly - 1 sets - 3 reps - 30 hold ?- Piriformis Mobilization on Foam Roll  - 1 x daily  - 7 x weekly - 1 sets - 1 reps - 2-3 min hold ?- Supine Quadriceps Stretch with Strap on Table  - 1 x daily - 7 x weekly - 1 sets - 3 reps - 30 hold ?- Prone Quadriceps Stretch with Strap  - 1 x daily - 7 x weekly - 1 sets - 3 reps - 30 hold ? ? ? ?ASSESSMENT: ? ?CLINICAL IMPRESSION: ?Pt reports improved sx's and pain since last PT visit.  She has been compliant with HEP and STM and reports feeling much better.  Pt reports groin pain has improved though she is having glute pain today.  Pt performed exercises well with cuing for correct form.  Pt responded well to Rx reporting improved pain after manual techniques and after Rx.  Patient should benefit from skilled PT to address goals and improve overall function.    ? ?    ? ? Objective impairments include decreased activity tolerance, decreased mobility, decreased strength, and pain. These impairments are limiting patient from occupation and workout activities . Personal factors including Time since onset of injury/illness/exacerbation are also affecting patient's functional outcome.  ? ? ?REHAB POTENTIAL: Good ? ?CLINICAL DECISION MAKING: Stable/uncomplicated ? ?EVALUATION COMPLEXITY: Low ? ? ?GOALS: ? ? ?SHORT TERM GOALS: ? ?STG Name Target Date Goal status  ?1 Pt will be independent and compliant with HEP for improved pain, strength, and function.  ?Baseline:  09/05/2021  MET  ?2 Pt will report at least a 25% improvement in pain and sx's overall  ?Baseline:  08/30/2021  MET  ?     ?     ?     ?     ?     ? ?LONG TERM GOALS:  ? ?LTG Name Target Date Goal status  ?1 Pt will be able to sleep at least 5/7 nights/week without pain disturbing her. ?Baseline: 10/17/2021  ongoing  ?2 Pt will demo improved core strength by progressing with core ex's without adverse effects and improved L hip strength to 5/5 MMT for improved tolerance with daily activities and improved tolerance with workout activities.  ?Baseline: 10/11/2021  ongoing  ?3 Pt will report improved tolerance with  workout activities to allow for pt to return to working out with her husband.  ?Baseline: 10/11/2021  ongoing  ?4 Pt will report she is able to perform her normal work activities without significant pain ?Baseline: 10/11/2021  ongoing  ?     ?     ?     ? ?PLAN: ?PT FREQUENCY: 1-2x/week ? ?PT DURATION: 8 weeks ? ?PLANNED INTERVENTIONS: Therapeutic exercises, Therapeutic activity, Neuro Muscular re-education, Patient/Family education, Joint mobilization, Stair training, Aquatic Therapy, Dry Needling, Electrical stimulation, Spinal mobilization, Cryotherapy, Moist heat, Taping, Traction, Ultrasound, and Manual therapy ? ?  PLAN FOR NEXT SESSION: focus on L hip mobility, flexibility, and hip strength ? ?Selinda Michaels III PT, DPT ?07/25/21 11:43 PM ? ? ? ? ? ? ? ?

## 2021-08-01 ENCOUNTER — Encounter (HOSPITAL_BASED_OUTPATIENT_CLINIC_OR_DEPARTMENT_OTHER): Payer: Self-pay

## 2021-08-01 ENCOUNTER — Ambulatory Visit (HOSPITAL_BASED_OUTPATIENT_CLINIC_OR_DEPARTMENT_OTHER): Payer: BC Managed Care – PPO | Admitting: Physical Therapy

## 2021-08-09 ENCOUNTER — Ambulatory Visit (HOSPITAL_BASED_OUTPATIENT_CLINIC_OR_DEPARTMENT_OTHER): Payer: BC Managed Care – PPO | Admitting: Orthopaedic Surgery

## 2021-08-09 DIAGNOSIS — M25852 Other specified joint disorders, left hip: Secondary | ICD-10-CM

## 2021-08-09 NOTE — Progress Notes (Signed)
? ?                            ? ? ?Chief Complaint: left hip pain ?  ? ? ?History of Present Illness:  ? ?08/09/2021: Presents today for follow-up of the left hip.  Overall she is not having any pain at today's visit.  She did get significant relief from her last injection.  She has begun yoga and working with physical therapy which is helping significantly.  She has also begun a new stretching regiment which also appears to help significantly. ? ?Jill Ashley is a 56 y.o. female presents today with left hip pain since 2019 which she describes as deep gluteal pain.  She not have any specific injury or accident.  She works at [employer] and enjoys being very active.  She states that the pain occurs specifically when laying on the side.  The symptoms in the posterior aspect of the hip.  She has not had injections in the past.  She has had physical therapy in the past although this was aggravating her pain somewhat.  She does have a history of lower back pain but denies pain radiating down the leg.  She states that when she is standing she will flex at the knee in order to relieve her pain.  She takes ibuprofen and muscle relaxers which helps somewhat. ? ? ? ?Surgical History:   ?None ? ?PMH/PSH/Family History/Social History/Meds/Allergies:   ? ?Past Medical History:  ?Diagnosis Date  ? Urticaria   ? ?Past Surgical History:  ?Procedure Laterality Date  ? ADENOIDECTOMY    ? BREAST EXCISIONAL BIOPSY Left 2008  ? [address] [place]  ? TONSILLECTOMY    ? ?Social History  ? ?Socioeconomic History  ? Marital status: Married  ?  Spouse name: Not on file  ? Number of children: Not on file  ? Years of education: Not on file  ? Highest education level: Not on file  ?Occupational History  ? Not on file  ?Tobacco Use  ? Smoking status: Former  ?  Packs/day: 0.25  ?  Years: 11.00  ?  Pack years: 2.75  ?  Types: Cigarettes  ?  Quit date: 2006  ?  Years since quitting: 17.3  ? Smokeless tobacco: Never  ?Vaping Use  ? Vaping Use:  Never used  ?Substance and Sexual Activity  ? Alcohol use: Not on file  ? Drug use: Not on file  ? Sexual activity: Not on file  ?Other Topics Concern  ? Not on file  ?Social History Narrative  ? Not on file  ? ?Social Determinants of Health  ? ?Financial Resource Strain: Not on file  ?Food Insecurity: Not on file  ?Transportation Needs: Not on file  ?Physical Activity: Not on file  ?Stress: Not on file  ?Social Connections: Not on file  ? ?Family History  ?Problem Relation Age of Onset  ? Diabetes Father   ? Hypertension Father   ? ?Allergies  ?Allergen Reactions  ? Penicillins Anaphylaxis and Hives  ?  At age 21  ? Morphine And Related Other (See Comments)  ?  Hyperventilation, heart racing, nausea  ? ?Current Outpatient Medications  ?Medication Sig Dispense Refill  ? amLODipine (NORVASC) 5 MG tablet TAKE 1 TABLET BY MOUTH ONCE A DAY 90 tablet 2  ? amLODipine (NORVASC) 5 MG tablet TAKE 1 TABLET BY MOUTH ONCE DAILY 30 tablet 3  ? Ascorbic Acid (VITAMIN C PO)  Take by mouth.    ? azelastine (ASTELIN) 0.1 % nasal spray 1 spray each nostril 1-2 times daily as needed 30 mL 5  ? BIOTIN PO Take by mouth.    ? Cholecalciferol (VITAMIN D3 PO) Take by mouth.    ? COVID-19 At Home Antigen Test [facility] COVID-19 HOME TEST) KIT Use as directed 4 each 0  ? Cyanocobalamin (VITAMIN B-12 PO) Take by mouth.    ? Estradiol-Progesterone (BIJUVA) 1-100 MG CAPS Take 1 capsule by mouth every evening. 90 capsule 4  ? estrogens, conjugated, (PREMARIN) 0.3 MG tablet Take 1 tablet (0.3 mg total) by mouth daily. 30 tablet 3  ? estrogens, conjugated, (PREMARIN) 0.45 MG tablet Take 1 tablet (0.45 mg total) by mouth daily. 30 tablet 3  ? estrogens, conjugated, (PREMARIN) 0.625 MG tablet Take 1 tablet (0.625 mg total) by mouth daily. 30 tablet 3  ? famotidine (PEPCID) 20 MG tablet Take 1 tablet (20 mg total) by mouth 2 (two) times daily as needed. 60 tablet 5  ? gabapentin (NEURONTIN) 100 MG capsule TAKE 1 CAPSULE BY MOUTH ONCE A DAY MAY  TITRATE UP BY 100MG EACH WEEK UP TO 900MG 120 capsule 3  ? gabapentin (NEURONTIN) 300 MG capsule Take 1 capsule (300 mg total) by mouth 2 (two) times daily. 60 capsule 6  ? gabapentin (NEURONTIN) 300 MG capsule Take 1 capsule (300 mg total) by mouth 2 (two) times daily. 180 capsule 1  ? GNP EVENING PRIMROSE OIL PO Take by mouth.    ? guaiFENesin (MUCINEX) 600 MG 12 hr tablet For thick post nasal drainage take 1-2 tablets 2 times daily as needed, use adequate hydration. 60 tablet 5  ? influenza vac split quadrivalent PF (FLUARIX) 0.5 ML injection Inject into the muscle. 0.5 mL 0  ? levocetirizine (XYZAL) 5 MG tablet Take 1 tablet (5 mg total) by mouth daily as needed for allergies. 30 tablet 5  ? Norethindrone-Ethinyl Estradiol-Fe Biphas (LO LOESTRIN FE) 1 MG-10 MCG / 10 MCG tablet TAKE 1 TABLET BY MOUTH ONCE A DAY 28 tablet 12  ? progesterone (PROMETRIUM) 100 MG capsule Take 1 capsule (100 mg total) by mouth daily as directed 30 capsule 3  ? Rhubarb (ESTROVEN COMPLETE PO) Take by mouth.    ? traZODone (DESYREL) 50 MG tablet TAKE 1 TABLET BY MOUTH EVERY DAY AT BEDTIME AS NEEDED    ? traZODone (DESYREL) 50 MG tablet Take 1 tablet (50 mg total) by mouth at bedtime as needed. 90 tablet 3  ? UNABLE TO FIND Med Name: Christiana Fuchs supplement    ? venlafaxine (EFFEXOR) 37.5 MG tablet Take 1 tablet (37.5 mg total) by mouth daily with food 30 tablet 6  ? Zoster Vaccine Adjuvanted [facility]) injection Inject into the muscle. 0.5 mL 0  ? ?No current facility-administered medications for this visit.  ? ?No results found. ? ?Review of Systems:   ?A ROS was performed including pertinent positives and negatives as documented in the HPI. ? ?Physical Exam :   ?Constitutional: NAD and appears stated age ?Neurological: Alert and oriented ?Psych: Appropriate affect and cooperative ?There were no vitals taken for this visit.  ? ?Comprehensive Musculoskeletal Exam:   ? ?Inspection Right Left  ?Skin No atrophy or gross abnormalities  appreciated No atrophy or gross abnormalities appreciated  ?Palpation    ?Tenderness None None  ?Crepitus None None  ?Range of Motion    ?Flexion (passive) 120 120  ?Extension 30 30  ?IR 30 30 some pain  ?ER 45  45 no pain  ?Strength    ?Flexion  5/5 5/5  ?Extension 5/5 5/5  ?Special Tests    ?FABIR Negative Negative  ?FADER Negative Negative  ?ER Lag/Capsular Insufficiency Negative Negative  ?Instability Negative Negative  ?Sacroiliac pain Negative  Negative   ?Instability    ?Generalized Laxity No No  ?Neurologic    ?sciatic, femoral, obturator nerves intact to light sensation  ?Vascular/Lymphatic    ?DP pulse 2+ 2+  ?Lumbar Exam    ?Patient has symmetric lumbar range of motion with negative pain referral to hip  ?Full strength with abduction on the side.  No pain with resisted hamstring curl prone. ? ? ? ?Imaging:   ?Xray (3 views left hip): ?Formal ? ? ?I personally reviewed and interpreted the radiographs. ? ? ?Assessment:   ?56 year old female with left ischiofemoral impingement which is overall doing much better with an injection and physical therapy.  I have advised her to continue yoga as this is helping significantly.  She will work with the goal of getting back to the gym.  I will see her back on an as-needed basis should she want additional injection. ?Plan :   ? ?-Return to clinic as needed ? ? ? ? ?I personally saw and evaluated the patient, and participated in the management and treatment plan. ? ?Vanetta Mulders, MD ?Attending Physician, Orthopedic Surgery ? ?This document was dictated using Systems analyst. A reasonable attempt at proof reading has been made to minimize errors. ?

## 2021-08-15 ENCOUNTER — Encounter (HOSPITAL_BASED_OUTPATIENT_CLINIC_OR_DEPARTMENT_OTHER): Payer: Self-pay | Admitting: Physical Therapy

## 2021-08-15 ENCOUNTER — Ambulatory Visit (HOSPITAL_BASED_OUTPATIENT_CLINIC_OR_DEPARTMENT_OTHER): Payer: BC Managed Care – PPO | Attending: Orthopaedic Surgery | Admitting: Physical Therapy

## 2021-08-15 DIAGNOSIS — M25552 Pain in left hip: Secondary | ICD-10-CM | POA: Diagnosis not present

## 2021-08-15 DIAGNOSIS — M6281 Muscle weakness (generalized): Secondary | ICD-10-CM | POA: Insufficient documentation

## 2021-08-15 NOTE — Therapy (Addendum)
OUTPATIENT PHYSICAL THERAPY TREATMENT NOTE  PHYSICAL THERAPY DISCHARGE SUMMARY  Visits from Start of Care: 9  Plan: Patient agrees to discharge.  Patient goals were not met. Patient is being discharged due to not returning to therapy.        Patient Name: Jill Ashley MRN: [id number] DOB:1966/02/04, 56 y.o., female Today's Date: 08/15/2021   PT End of Session - 08/15/21 1631     Visit Number 9    Number of Visits 12    Date for PT Re-Evaluation 10/17/21    PT Start Time 1600    PT Stop Time 1630    PT Time Calculation (min) 30 min    Activity Tolerance Patient tolerated treatment well    Behavior During Therapy [facility] for tasks assessed/performed                     Past Medical History:  Diagnosis Date   Urticaria    Past Surgical History:  Procedure Laterality Date   ADENOIDECTOMY     BREAST EXCISIONAL BIOPSY Left 2008   [place], [place]   TONSILLECTOMY     Patient Active Problem List   Diagnosis Date Noted   Recurrent urticaria 05/25/2019   Chronic rhinitis 05/25/2019    PCP: Pcp, No  REFERRING PROVIDER: Vanetta Mulders, MD  REFERRING DIAG: M25.559 (ICD-10-CM) - Hip pain   THERAPY DIAG:  Pain in left hip  Muscle weakness (generalized)  ONSET DATE: Chronic / MD script 04/06/2021  SUBJECTIVE:   SUBJECTIVE STATEMENT:  Pt states that about 4-5 days ago, she had to lift something at Ashley awkward angle that was heavier than in expected. It was in a SB type motion. Pt states that prior to that everything was going well. She has been going back to the gym for yoga, HEP, and added in lunges.   Pt states she is about 80% better- the last piece missing is that "setback" in the hip with the lifting.       PERTINENT HISTORY: Chronic hip pain, Hx of L knee pain  PAIN:  Are you having pain? No NPRS scale: 0/10 Pain location: L glute and L post hip;  Pt also states she has a cold, burning pain under R glute Aggravating factors: lying on L  side Relieving factors:    OCCUPATION: She works at [employer] and enjoys being very active.  Pt states she walks 15,000 steps per day  PLOF: Independent;   PATIENT GOALS improved sleeping; return to gym activities and be able to workout with husband   OBJECTIVE:   DIAGNOSTIC FINDINGS:  x ray which showed no acute bony abnormality.  small soft tissue calcifications adjacent to the greater trochanters bilaterally which may be related to old injury or chronic repetitive stress injury (In Epic)  TODAY'S TREATMENT:  Therapeutic Exercise:  8" box step up fwd and lateral 2x10 Staggered RDL 15lbs 15x Suitcase carry 15lbs 64f 3x laps Figure 4 SL from bench 10x each Black TB paloff double band 10x each   PATIENT EDUCATION:  Education details: exercise form, HEP, POC. Person educated: Patient Education method: Explanation and visual, verbal and tactile cues Education comprehension: verbalized understanding and needs further education verbal and tactile cues required.   HOME EXERCISE PROGRAM: Access Code: [email or web address] URL: [email or web address] Date: 07/19/2021 Prepared by: ADaleen Bo Exercises - Side Stepping with Resistance at Thighs  - 1 x daily - 5 x weekly - 1 sets - 2 reps - 369fhold - Seated Piriformis Stretch  -  2 x daily - 7 x weekly - 1 sets - 3 reps - 30 hold - Piriformis Mobilization on Foam Roll  - 1 x daily - 7 x weekly - 1 sets - 1 reps - 2-3 min hold - Supine Quadriceps Stretch with Strap on Table  - 1 x daily - 7 x weekly - 1 sets - 3 reps - 30 hold - Prone Quadriceps Stretch with Strap  - 1 x daily - 7 x weekly - 1 sets - 3 reps - 30 hold    ASSESSMENT:  CLINICAL IMPRESSION: Pt continues to make signficant improvement in L hip mobility, strength, and tolerance to functional loading. Pt's report of injury to L hip appears consistent with grade I strain of her glute. Pt did not have increased pain with any exercise a today's session. Pt HEP  progressed verbally to include more SL CKC activity and lumbopelvic stability. Pt to decrease frequency of visits at this time given signficant progress. Pt is very independent with HEP and is exercising regularly at this time in conjunction with her yoga, which is likely improving her joint mobility and soft tissue extensibility.  Patient should benefit from skilled PT to address goals and improve overall function.           Objective impairments include decreased activity tolerance, decreased mobility, decreased strength, and pain. These impairments are limiting patient from occupation and workout activities . Personal factors including Time since onset of injury/illness/exacerbation are also affecting patient's functional outcome.    REHAB POTENTIAL: Good  CLINICAL DECISION MAKING: Stable/uncomplicated  EVALUATION COMPLEXITY: Low   GOALS:   SHORT TERM GOALS:  STG Name Target Date Goal status  1 Pt will be independent and compliant with HEP for improved pain, strength, and function.  Baseline:  09/26/2021  MET  2 Pt will report at least a 25% improvement in pain and sx's overall  Baseline:  08/30/2021  MET                            LONG TERM GOALS:   LTG Name Target Date Goal status  1 Pt will be able to sleep at least 5/7 nights/week without pain disturbing her. Baseline: 11/07/2021  ongoing  2 Pt will demo improved core strength by progressing with core ex's without adverse effects and improved L hip strength to 5/5 MMT for improved tolerance with daily activities and improved tolerance with workout activities.  Baseline: 10/11/2021  ongoing  3 Pt will report improved tolerance with workout activities to allow for pt to return to working out with her husband.  Baseline: 10/11/2021  ongoing  4 Pt will report she is able to perform her normal work activities without significant pain Baseline: 10/11/2021  ongoing                  PLAN: PT FREQUENCY: 1-2x/week  PT DURATION:  8 weeks  PLANNED INTERVENTIONS: Therapeutic exercises, Therapeutic activity, Neuro Muscular re-education, Patient/Family education, Joint mobilization, Stair training, Aquatic Therapy, Dry Needling, Electrical stimulation, Spinal mobilization, Cryotherapy, Moist heat, Taping, Traction, Ultrasound, and Manual therapy  PLAN FOR NEXT SESSION: progress SL strength, add in gym strengthening for postero lateral hip- standing fire hydrant  Daleen Bo PT, DPT 08/15/21 4:35 PM

## 2021-08-22 ENCOUNTER — Encounter (HOSPITAL_BASED_OUTPATIENT_CLINIC_OR_DEPARTMENT_OTHER): Payer: BC Managed Care – PPO | Admitting: Physical Therapy

## 2021-08-29 ENCOUNTER — Ambulatory Visit (HOSPITAL_BASED_OUTPATIENT_CLINIC_OR_DEPARTMENT_OTHER): Payer: BC Managed Care – PPO | Admitting: Physical Therapy

## 2021-08-29 ENCOUNTER — Encounter (HOSPITAL_BASED_OUTPATIENT_CLINIC_OR_DEPARTMENT_OTHER): Payer: Self-pay

## 2021-08-29 NOTE — Therapy (Incomplete)
?OUTPATIENT PHYSICAL THERAPY TREATMENT NOTE ? ? ?Patient Name: Jill Ashley ?MRN: [id number] ?DOB:1965/12/14, 56 y.o., female ?Today's Date: 08/29/2021 ? ? ? ? ? ? ? ? ? ? ? ? ?Past Medical History:  ?Diagnosis Date  ? Urticaria   ? ?Past Surgical History:  ?Procedure Laterality Date  ? ADENOIDECTOMY    ? BREAST EXCISIONAL BIOPSY Left 2008  ? [address], [place]  ? TONSILLECTOMY    ? ?Patient Active Problem List  ? Diagnosis Date Noted  ? Recurrent urticaria 05/25/2019  ? Chronic rhinitis 05/25/2019  ? ? ?PCP: Pcp, No ? ?REFERRING PROVIDER: Vanetta Mulders, MD ? ?REFERRING DIAG: M25.559 (ICD-10-CM) - Hip pain  ? ?THERAPY DIAG:  ?No diagnosis found. ? ?ONSET DATE: Chronic / MD script 04/06/2021 ? ?SUBJECTIVE:  ? ?SUBJECTIVE STATEMENT: ? ?Pt states that about 4-5 days ago, she had to lift something at an awkward angle that was heavier than in expected. It was in a SB type motion. Pt states that prior to that everything was going well. She has been going back to the gym for yoga, HEP, and added in lunges.  ? ?Pt states she is about 80% better- the last piece missing is that "setback" in the hip with the lifting.  ? ?    ?PERTINENT HISTORY: ?Chronic hip pain, Hx of L knee pain ? ?PAIN:  ?Are you having pain? No ?NPRS scale: 0/10 ?Pain location: L glute and L post hip;  Pt also states she has a cold, burning pain under R glute ?Aggravating factors: lying on L side ?Relieving factors:  ? ? ?OCCUPATION: She works at [employer] and enjoys being very active.  Pt states she walks 15,000 steps per day ? ?PLOF: Independent;  ? ?PATIENT GOALS improved sleeping; return to gym activities and be able to workout with husband ? ? ?OBJECTIVE:  ? ?DIAGNOSTIC FINDINGS:  x ray which showed no acute bony abnormality.  small soft tissue calcifications adjacent ?to the greater trochanters bilaterally which may be related to old injury or chronic repetitive stress injury (In Epic) ? ?TODAY'S TREATMENT: ? ?Therapeutic Exercise: ? ?8" box step  up fwd and lateral 2x10 ?Staggered RDL 15lbs 15x ?Suitcase carry 15lbs 32f 3x laps ?Figure 4 SL from bench 10x each ?Black TB paloff double band 10x each  ? ?PATIENT EDUCATION:  ?Education details: exercise form, HEP, POC. ?Person educated: Patient ?Education method: Explanation and visual, verbal and tactile cues ?Education comprehension: verbalized understanding and needs further education verbal and tactile cues required. ? ? ?HOME EXERCISE PROGRAM: ?Access Code: [email or web address] ?URL: [email or web address] ?Date: 07/19/2021 ?Prepared by: ADaleen Bo? ?Exercises ?- Side Stepping with Resistance at Thighs  - 1 x daily - 5 x weekly - 1 sets - 2 reps - 339fhold ?- Seated Piriformis Stretch  - 2 x daily - 7 x weekly - 1 sets - 3 reps - 30 hold ?- Piriformis Mobilization on Foam Roll  - 1 x daily - 7 x weekly - 1 sets - 1 reps - 2-3 min hold ?- Supine Quadriceps Stretch with Strap on Table  - 1 x daily - 7 x weekly - 1 sets - 3 reps - 30 hold ?- Prone Quadriceps Stretch with Strap  - 1 x daily - 7 x weekly - 1 sets - 3 reps - 30 hold ? ? ? ?ASSESSMENT: ? ?CLINICAL IMPRESSION: ?Pt continues to make signficant improvement in L hip mobility, strength, and tolerance to functional loading. Pt's report of injury to L hip appears consistent  with grade I strain of her glute. Pt did not have increased pain with any exercise a today's session. Pt HEP progressed verbally to include more SL CKC activity and lumbopelvic stability. Pt to decrease frequency of visits at this time given signficant progress. Pt is very independent with HEP and is exercising regularly at this time in conjunction with her yoga, which is likely improving her joint mobility and soft tissue extensibility.  Patient should benefit from skilled PT to address goals and improve overall function.    ? ?    ? ? Objective impairments include decreased activity tolerance, decreased mobility, decreased strength, and pain. These impairments are limiting  patient from occupation and workout activities . Personal factors including Time since onset of injury/illness/exacerbation are also affecting patient's functional outcome.  ? ? ?REHAB POTENTIAL: Good ? ?CLINICAL DECISION MAKING: Stable/uncomplicated ? ?EVALUATION COMPLEXITY: Low ? ? ?GOALS: ? ? ?SHORT TERM GOALS: ? ?STG Name Target Date Goal status  ?1 Pt will be independent and compliant with HEP for improved pain, strength, and function.  ?Baseline:  10/10/2021  MET  ?2 Pt will report at least a 25% improvement in pain and sx's overall  ?Baseline:  08/30/2021  MET  ?     ?     ?     ?     ?     ? ?LONG TERM GOALS:  ? ?LTG Name Target Date Goal status  ?1 Pt will be able to sleep at least 5/7 nights/week without pain disturbing her. ?Baseline: 11/21/2021  ongoing  ?2 Pt will demo improved core strength by progressing with core ex's without adverse effects and improved L hip strength to 5/5 MMT for improved tolerance with daily activities and improved tolerance with workout activities.  ?Baseline: 10/11/2021  ongoing  ?3 Pt will report improved tolerance with workout activities to allow for pt to return to working out with her husband.  ?Baseline: 10/11/2021  ongoing  ?4 Pt will report she is able to perform her normal work activities without significant pain ?Baseline: 10/11/2021  ongoing  ?     ?     ?     ? ?PLAN: ?PT FREQUENCY: 1-2x/week ? ?PT DURATION: 8 weeks ? ?PLANNED INTERVENTIONS: Therapeutic exercises, Therapeutic activity, Neuro Muscular re-education, Patient/Family education, Joint mobilization, Stair training, Aquatic Therapy, Dry Needling, Electrical stimulation, Spinal mobilization, Cryotherapy, Moist heat, Taping, Traction, Ultrasound, and Manual therapy ? ?PLAN FOR NEXT SESSION: progress SL strength, add in gym strengthening for postero lateral hip- standing fire hydrant ? ?Daleen Bo PT, DPT ?08/29/21 1:07 PM ? ? ? ? ? ? ? ? ?

## 2021-09-06 ENCOUNTER — Other Ambulatory Visit: Payer: Self-pay | Admitting: Obstetrics and Gynecology

## 2021-09-06 ENCOUNTER — Other Ambulatory Visit: Payer: Self-pay | Admitting: Obstetrics & Gynecology

## 2021-09-06 DIAGNOSIS — Z1231 Encounter for screening mammogram for malignant neoplasm of breast: Secondary | ICD-10-CM

## 2021-09-13 ENCOUNTER — Ambulatory Visit
Admission: RE | Admit: 2021-09-13 | Discharge: 2021-09-13 | Disposition: A | Discharge status: Home / Self Care | Payer: BC Managed Care – PPO | Source: Ambulatory Visit | Attending: Obstetrics and Gynecology | Admitting: Obstetrics and Gynecology

## 2021-09-13 DIAGNOSIS — Z1231 Encounter for screening mammogram for malignant neoplasm of breast: Secondary | ICD-10-CM | POA: Diagnosis not present

## 2021-09-28 ENCOUNTER — Other Ambulatory Visit: Payer: Self-pay | Admitting: Physician Assistant

## 2021-09-28 ENCOUNTER — Ambulatory Visit
Admission: RE | Admit: 2021-09-28 | Discharge: 2021-09-28 | Disposition: A | Discharge status: Home / Self Care | Payer: BC Managed Care – PPO | Source: Ambulatory Visit | Attending: Physician Assistant | Admitting: Physician Assistant

## 2021-09-28 DIAGNOSIS — R053 Chronic cough: Secondary | ICD-10-CM | POA: Diagnosis not present

## 2021-10-19 DIAGNOSIS — J4520 Mild intermittent asthma, uncomplicated: Secondary | ICD-10-CM | POA: Diagnosis not present

## 2021-10-19 DIAGNOSIS — N898 Other specified noninflammatory disorders of vagina: Secondary | ICD-10-CM | POA: Diagnosis not present

## 2021-10-19 DIAGNOSIS — Z01419 Encounter for gynecological examination (general) (routine) without abnormal findings: Secondary | ICD-10-CM | POA: Diagnosis not present

## 2021-10-19 DIAGNOSIS — M549 Dorsalgia, unspecified: Secondary | ICD-10-CM | POA: Diagnosis not present

## 2021-10-19 DIAGNOSIS — M5430 Sciatica, unspecified side: Secondary | ICD-10-CM | POA: Diagnosis not present

## 2021-10-19 DIAGNOSIS — Z789 Other specified health status: Secondary | ICD-10-CM | POA: Diagnosis not present

## 2021-10-19 DIAGNOSIS — Z01818 Encounter for other preprocedural examination: Secondary | ICD-10-CM | POA: Diagnosis not present

## 2021-10-25 ENCOUNTER — Other Ambulatory Visit: Payer: Self-pay | Admitting: *Deleted

## 2021-10-25 DIAGNOSIS — M25561 Pain in right knee: Secondary | ICD-10-CM

## 2021-11-15 ENCOUNTER — Ambulatory Visit (HOSPITAL_COMMUNITY)
Admission: RE | Admit: 2021-11-15 | Discharge: 2021-11-15 | Disposition: A | Discharge status: Home / Self Care | Payer: BC Managed Care – PPO | Source: Ambulatory Visit | Attending: Vascular Surgery | Admitting: Vascular Surgery

## 2021-11-15 ENCOUNTER — Ambulatory Visit: Payer: BC Managed Care – PPO | Admitting: Vascular Surgery

## 2021-11-15 ENCOUNTER — Encounter: Payer: Self-pay | Admitting: Vascular Surgery

## 2021-11-15 VITALS — BP 124/83 | HR 70 | Temp 98.6°F | Resp 16 | Ht 61.0 in | Wt 145.7 lb

## 2021-11-15 DIAGNOSIS — M25562 Pain in left knee: Secondary | ICD-10-CM

## 2021-11-15 DIAGNOSIS — I872 Venous insufficiency (chronic) (peripheral): Secondary | ICD-10-CM | POA: Diagnosis not present

## 2021-11-15 DIAGNOSIS — M25561 Pain in right knee: Secondary | ICD-10-CM | POA: Diagnosis not present

## 2021-11-15 NOTE — Progress Notes (Signed)
ASSESSMENT & PLAN   CHRONIC VENOUS INSUFFICIENCY: This patient has mild venous insufficiency.  She has some deep venous reflux in the common femoral vein and some superficial venous reflux in the small saphenous vein.  We have discussed the importance of intermittent daily leg elevation and the proper positioning for this.  I have encouraged her to get some knee-high compression stockings with a gradient of 15 to 20 mmHg.  We discussed the importance of exercise specifically walking and water aerobics.  I encouraged her to avoid prolonged sitting and standing.  Currently she does not appear to be a candidate for laser ablation of the small saphenous vein.  If her symptoms progress in the future then certainly we can reevaluate this.  REASON FOR CONSULT:    Painful varicose veins.  The consult is requested by Dr. Thayer Ohm.   HPI:   Jill Ashley is a 56 y.o. female who was referred with painful varicose veins.  I have reviewed the records from the referring office.  The patient was seen on 06/14/2021.  The patient does have a history of hypertension, hypercholesterolemia, tachycardia.  On my history, the patient complains of some swelling, aching pain, and heaviness in the right leg which is aggravated by standing and relieved with elevation.  Of note she underwent laser ablation of the right great saphenous vein it sounds like in 2016 in [place].  She had developed some recurrent symptoms on the right side and presents for venous evaluation.  She denies any previous history of DVT.  She has not been wearing compression stockings.  She does elevate her legs which helps her symptoms.  She works at [employer] and is on her feet most of her shift.  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  Past Medical History:  Diagnosis Date   Urticaria     Family History  Problem Relation Age of Onset   Varicose Veins Mother    Diabetes  Father    Hypertension Father    Varicose Veins Maternal Grandmother     SOCIAL HISTORY: Social History   Tobacco Use   Smoking status: Former    Packs/day: 0.25    Years: 11.00    Total pack years: 2.75    Types: Cigarettes    Quit date: 2006    Years since quitting: 17.5   Smokeless tobacco: Never  Substance Use Topics   Alcohol use: Not on file    Allergies  Allergen Reactions   Penicillins Anaphylaxis and Hives    At age 78   Morphine And Related Other (See Comments)    Hyperventilation, heart racing, nausea    Current Outpatient Medications  Medication Sig Dispense Refill   Ascorbic Acid (VITAMIN C PO) Take by mouth.     BIOTIN PO Take by mouth.     Cholecalciferol (VITAMIN D3 PO) Take by mouth.     COVID-19 At Home Antigen Test [facility] COVID-19 HOME TEST) KIT Use as directed 4 each 0   estradiol (VIVELLE-DOT) 0.1 MG/24HR patch 1 patch 2 (two) times a week.     progesterone (PROMETRIUM) 100 MG capsule Take 1 capsule (100 mg total) by mouth daily as directed 30 capsule 3   Zoster Vaccine Adjuvanted [facility]) injection Inject into the muscle. 0.5 mL 0   amLODipine (NORVASC) 5 MG tablet TAKE 1 TABLET BY MOUTH ONCE A DAY 90 tablet 2   amLODipine (NORVASC)  5 MG tablet TAKE 1 TABLET BY MOUTH ONCE DAILY 30 tablet 3   azelastine (ASTELIN) 0.1 % nasal spray 1 spray each nostril 1-2 times daily as needed 30 mL 5   Cyanocobalamin (VITAMIN B-12 PO) Take by mouth.     Estradiol-Progesterone (BIJUVA) 1-100 MG CAPS Take 1 capsule by mouth every evening. 90 capsule 4   estrogens, conjugated, (PREMARIN) 0.3 MG tablet Take 1 tablet (0.3 mg total) by mouth daily. 30 tablet 3   estrogens, conjugated, (PREMARIN) 0.45 MG tablet Take 1 tablet (0.45 mg total) by mouth daily. 30 tablet 3   estrogens, conjugated, (PREMARIN) 0.625 MG tablet Take 1 tablet (0.625 mg total) by mouth daily. 30 tablet 3   famotidine (PEPCID) 20 MG tablet Take 1 tablet (20 mg total) by mouth 2 (two) times daily  as needed. 60 tablet 5   gabapentin (NEURONTIN) 100 MG capsule TAKE 1 CAPSULE BY MOUTH ONCE A DAY MAY TITRATE UP BY 100MG EACH WEEK UP TO 900MG 120 capsule 3   gabapentin (NEURONTIN) 300 MG capsule Take 1 capsule (300 mg total) by mouth 2 (two) times daily. 60 capsule 6   gabapentin (NEURONTIN) 300 MG capsule Take 1 capsule (300 mg total) by mouth 2 (two) times daily. 180 capsule 1   GNP EVENING PRIMROSE OIL PO Take by mouth.     guaiFENesin (MUCINEX) 600 MG 12 hr tablet For thick post nasal drainage take 1-2 tablets 2 times daily as needed, use adequate hydration. 60 tablet 5   influenza vac split quadrivalent PF (FLUARIX) 0.5 ML injection Inject into the muscle. 0.5 mL 0   levocetirizine (XYZAL) 5 MG tablet Take 1 tablet (5 mg total) by mouth daily as needed for allergies. 30 tablet 5   Norethindrone-Ethinyl Estradiol-Fe Biphas (LO LOESTRIN FE) 1 MG-10 MCG / 10 MCG tablet TAKE 1 TABLET BY MOUTH ONCE A DAY 28 tablet 12   Rhubarb (ESTROVEN COMPLETE PO) Take by mouth.     traZODone (DESYREL) 50 MG tablet TAKE 1 TABLET BY MOUTH EVERY DAY AT BEDTIME AS NEEDED     traZODone (DESYREL) 50 MG tablet Take 1 tablet (50 mg total) by mouth at bedtime as needed. (Patient not taking: Reported on 11/15/2021) 90 tablet 3   UNABLE TO FIND Med Name: Christiana Fuchs supplement     venlafaxine (EFFEXOR) 37.5 MG tablet Take 1 tablet (37.5 mg total) by mouth daily with food 30 tablet 6   No current facility-administered medications for this visit.    REVIEW OF SYSTEMS:  [X]  denotes positive finding, [ ]  denotes negative finding Cardiac  Comments:  Chest pain or chest pressure:    Shortness of breath upon exertion:    Short of breath when lying flat:    Irregular heart rhythm:        Vascular    Pain in calf, thigh, or hip brought on by ambulation:    Pain in feet at night that wakes you up from your sleep:     Blood clot in your veins:    Leg swelling:  x       Pulmonary    Oxygen at home:    Productive  cough:     Wheezing:         Neurologic    Sudden weakness in arms or legs:     Sudden numbness in arms or legs:     Sudden onset of difficulty speaking or slurred speech:    Temporary loss of vision in one eye:  Problems with dizziness:         Gastrointestinal    Blood in stool:     Vomited blood:         Genitourinary    Burning when urinating:     Blood in urine:        Psychiatric    Major depression:         Hematologic    Bleeding problems:    Problems with blood clotting too easily:        Skin    Rashes or ulcers:        Constitutional    Fever or chills:    -  PHYSICAL EXAM:   Vitals:   11/15/21 1336  BP: 124/83  Pulse: 70  Resp: 16  Temp: 98.6 F (37 C)  TempSrc: Temporal  SpO2: 100%  Weight: 145 lb 11.2 oz (66.1 kg)  Height: 5' 1"  (1.549 m)   Body mass index is 27.53 kg/m. GENERAL: The patient is a well-nourished female, in no acute distress. The vital signs are documented above. CARDIAC: There is a regular rate and rhythm.  VASCULAR: I do not detect carotid bruits. I could not palpate pedal pulses however she had a biphasic dorsalis pedis and posterior tibial signal bilaterally. She has no significant lower extremity swelling. She has a reticular vein in her posterior right calf.   I did look at her right small saphenous vein myself with the SonoSite.  She has a short segment of reflux over the vein is not especially dilated.  PULMONARY: There is good air exchange bilaterally without wheezing or rales. ABDOMEN: Soft and non-tender with normal pitched bowel sounds.  MUSCULOSKELETAL: There are no major deformities. NEUROLOGIC: No focal weakness or paresthesias are detected. SKIN: There are no ulcers or rashes noted. PSYCHIATRIC: The patient has a normal affect.  DATA:    VENOUS DUPLEX: I have independently interpreted her venous duplex scan of the right lower extremity.  There was no evidence of DVT.  There was deep venous reflux in  the common femoral vein.  There was minimal superficial venous reflux.  There was some reflux in the small saphenous vein in the proximal calf.  The vein measured up to 4.3 mm in diameter.     Deitra Mayo [facility]

## 2021-11-29 DIAGNOSIS — K5730 Diverticulosis of large intestine without perforation or abscess without bleeding: Secondary | ICD-10-CM | POA: Diagnosis not present

## 2021-11-29 DIAGNOSIS — K648 Other hemorrhoids: Secondary | ICD-10-CM | POA: Diagnosis not present

## 2021-11-29 DIAGNOSIS — Z800 Family history of malignant neoplasm of digestive organs: Secondary | ICD-10-CM | POA: Diagnosis not present

## 2021-11-29 DIAGNOSIS — Z1211 Encounter for screening for malignant neoplasm of colon: Secondary | ICD-10-CM | POA: Diagnosis not present

## 2021-11-29 DIAGNOSIS — K644 Residual hemorrhoidal skin tags: Secondary | ICD-10-CM | POA: Diagnosis not present

## 2022-01-02 DIAGNOSIS — F902 Attention-deficit hyperactivity disorder, combined type: Secondary | ICD-10-CM | POA: Diagnosis not present

## 2022-01-02 DIAGNOSIS — F341 Dysthymic disorder: Secondary | ICD-10-CM | POA: Diagnosis not present

## 2022-01-02 DIAGNOSIS — F331 Major depressive disorder, recurrent, moderate: Secondary | ICD-10-CM | POA: Diagnosis not present

## 2022-01-02 DIAGNOSIS — F4312 Post-traumatic stress disorder, chronic: Secondary | ICD-10-CM | POA: Diagnosis not present

## 2022-01-16 ENCOUNTER — Other Ambulatory Visit (HOSPITAL_BASED_OUTPATIENT_CLINIC_OR_DEPARTMENT_OTHER): Payer: Self-pay

## 2022-01-16 MED ORDER — ZOSTER VAC RECOMB ADJUVANTED 50 MCG/0.5ML IM SUSR
INTRAMUSCULAR | 0 refills | Status: DC
  Filled 2022-01-16: qty 0.5, 1d supply, fill #0

## 2022-01-31 ENCOUNTER — Other Ambulatory Visit (HOSPITAL_BASED_OUTPATIENT_CLINIC_OR_DEPARTMENT_OTHER): Payer: Self-pay

## 2022-01-31 MED ORDER — FLUARIX QUADRIVALENT 0.5 ML IM SUSY
PREFILLED_SYRINGE | INTRAMUSCULAR | 0 refills | Status: DC
  Filled 2022-01-31: qty 0.5, 1d supply, fill #0

## 2022-02-13 DIAGNOSIS — F341 Dysthymic disorder: Secondary | ICD-10-CM | POA: Diagnosis not present

## 2022-02-13 DIAGNOSIS — F4312 Post-traumatic stress disorder, chronic: Secondary | ICD-10-CM | POA: Diagnosis not present

## 2022-02-13 DIAGNOSIS — F902 Attention-deficit hyperactivity disorder, combined type: Secondary | ICD-10-CM | POA: Diagnosis not present

## 2022-02-13 DIAGNOSIS — F331 Major depressive disorder, recurrent, moderate: Secondary | ICD-10-CM | POA: Diagnosis not present

## 2022-03-13 DIAGNOSIS — F902 Attention-deficit hyperactivity disorder, combined type: Secondary | ICD-10-CM | POA: Diagnosis not present

## 2022-03-13 DIAGNOSIS — F4312 Post-traumatic stress disorder, chronic: Secondary | ICD-10-CM | POA: Diagnosis not present

## 2022-03-13 DIAGNOSIS — F331 Major depressive disorder, recurrent, moderate: Secondary | ICD-10-CM | POA: Diagnosis not present

## 2022-03-13 DIAGNOSIS — F341 Dysthymic disorder: Secondary | ICD-10-CM | POA: Diagnosis not present

## 2022-04-23 ENCOUNTER — Other Ambulatory Visit (HOSPITAL_BASED_OUTPATIENT_CLINIC_OR_DEPARTMENT_OTHER): Payer: Self-pay

## 2022-04-23 MED ORDER — COMIRNATY 30 MCG/0.3ML IM SUSY
0.3000 mL | PREFILLED_SYRINGE | INTRAMUSCULAR | 0 refills | Status: DC
  Filled 2022-04-23: qty 0.3, 1d supply, fill #0

## 2022-05-10 DIAGNOSIS — F902 Attention-deficit hyperactivity disorder, combined type: Secondary | ICD-10-CM | POA: Diagnosis not present

## 2022-05-10 DIAGNOSIS — F331 Major depressive disorder, recurrent, moderate: Secondary | ICD-10-CM | POA: Diagnosis not present

## 2022-05-10 DIAGNOSIS — F4312 Post-traumatic stress disorder, chronic: Secondary | ICD-10-CM | POA: Diagnosis not present

## 2022-05-10 DIAGNOSIS — F341 Dysthymic disorder: Secondary | ICD-10-CM | POA: Diagnosis not present

## 2022-06-06 DIAGNOSIS — F4312 Post-traumatic stress disorder, chronic: Secondary | ICD-10-CM | POA: Diagnosis not present

## 2022-06-06 DIAGNOSIS — F331 Major depressive disorder, recurrent, moderate: Secondary | ICD-10-CM | POA: Diagnosis not present

## 2022-06-06 DIAGNOSIS — F341 Dysthymic disorder: Secondary | ICD-10-CM | POA: Diagnosis not present

## 2022-06-06 DIAGNOSIS — F902 Attention-deficit hyperactivity disorder, combined type: Secondary | ICD-10-CM | POA: Diagnosis not present

## 2022-06-19 DIAGNOSIS — F902 Attention-deficit hyperactivity disorder, combined type: Secondary | ICD-10-CM | POA: Diagnosis not present

## 2022-06-19 DIAGNOSIS — F341 Dysthymic disorder: Secondary | ICD-10-CM | POA: Diagnosis not present

## 2022-06-19 DIAGNOSIS — F4312 Post-traumatic stress disorder, chronic: Secondary | ICD-10-CM | POA: Diagnosis not present

## 2022-06-19 DIAGNOSIS — F331 Major depressive disorder, recurrent, moderate: Secondary | ICD-10-CM | POA: Diagnosis not present

## 2022-06-20 DIAGNOSIS — E7800 Pure hypercholesterolemia, unspecified: Secondary | ICD-10-CM | POA: Diagnosis not present

## 2022-06-20 DIAGNOSIS — J45991 Cough variant asthma: Secondary | ICD-10-CM | POA: Diagnosis not present

## 2022-06-20 DIAGNOSIS — N951 Menopausal and female climacteric states: Secondary | ICD-10-CM | POA: Diagnosis not present

## 2022-06-20 DIAGNOSIS — F439 Reaction to severe stress, unspecified: Secondary | ICD-10-CM | POA: Diagnosis not present

## 2022-06-20 DIAGNOSIS — I10 Essential (primary) hypertension: Secondary | ICD-10-CM | POA: Diagnosis not present

## 2022-06-20 DIAGNOSIS — Z0000 Encounter for general adult medical examination without abnormal findings: Secondary | ICD-10-CM | POA: Diagnosis not present

## 2022-07-04 DIAGNOSIS — F902 Attention-deficit hyperactivity disorder, combined type: Secondary | ICD-10-CM | POA: Diagnosis not present

## 2022-07-04 DIAGNOSIS — F4312 Post-traumatic stress disorder, chronic: Secondary | ICD-10-CM | POA: Diagnosis not present

## 2022-07-04 DIAGNOSIS — F900 Attention-deficit hyperactivity disorder, predominantly inattentive type: Secondary | ICD-10-CM | POA: Diagnosis not present

## 2022-07-04 DIAGNOSIS — F331 Major depressive disorder, recurrent, moderate: Secondary | ICD-10-CM | POA: Diagnosis not present

## 2022-07-06 ENCOUNTER — Ambulatory Visit: Payer: BC Managed Care – PPO | Admitting: Pulmonary Disease

## 2022-07-06 ENCOUNTER — Encounter: Payer: Self-pay | Admitting: Pulmonary Disease

## 2022-07-06 VITALS — BP 102/68 | HR 97 | Ht 62.0 in | Wt 156.8 lb

## 2022-07-06 DIAGNOSIS — R0602 Shortness of breath: Secondary | ICD-10-CM

## 2022-07-06 NOTE — Progress Notes (Signed)
Jill Ashley    ZG:[id number]    1965-09-13  Primary Care Physician:Clelland, Delrae Rend, PA  Referring Physician: Johna Roles, [place] [address] [place],  [place] [address]  Chief complaint:   Patient being seen for chronic cough  HPI:  Cough only started following a COVID infection about 14 months ago Has had a persistent cough on and off Started on Symbicort which she is using only as needed She does use a nebulizer almost twice a day, has a rescue albuterol puffer to use which she is using as needed-demonstrated good technique  Does not have a prior history of asthma History of hypertension not well-controlled  Does admit to occasional snoring but does not have any other symptoms to suggest significant sleep disordered breathing  Weight is up about 15 pounds  She does occasionally feel tightness in her chest  Sometimes, the MDI does not work as well and she has a portable nebulizer that she does use occasionally  Quit smoking in 2007 was only smoking about 2-3 6 cigarettes  She is married, worked Scientist, research (medical)  No other significant occupational predisposition or hobby predisposition to lung disease  Outpatient Encounter Medications as of 07/06/2022  Medication Sig   albuterol (VENTOLIN HFA) 108 (90 Base) MCG/ACT inhaler SMARTSIG:2 Puff(s) By Mouth Every 4 Hours PRN   Ascorbic Acid (VITAMIN C PO) Take by mouth.   BIOTIN PO Take by mouth.   budesonide-formoterol (SYMBICORT) 80-4.5 MCG/ACT inhaler SMARTSIG:2 Puff(s) By Mouth Twice Daily   buPROPion (WELLBUTRIN SR) 200 MG 12 hr tablet Take 200 mg by mouth every morning.   Cholecalciferol (VITAMIN D3 PO) Take by mouth.   citalopram (CELEXA) 20 MG tablet Take 20 mg by mouth every morning.   estradiol (VIVELLE-DOT) 0.1 MG/24HR patch 1 patch 2 (two) times a week.   ipratropium-albuterol (DUONEB) 0.5-2.5 (3) MG/3ML SOLN SMARTSIG:3 Milliliter(s) Via Nebulizer Every 6 Hours PRN   montelukast (SINGULAIR) 10 MG  tablet Take 10 mg by mouth daily.   progesterone (PROMETRIUM) 100 MG capsule Take 1 capsule (100 mg total) by mouth daily as directed   traZODone (DESYREL) 50 MG tablet Take 1 tablet (50 mg total) by mouth at bedtime as needed.   amLODipine (NORVASC) 5 MG tablet TAKE 1 TABLET BY MOUTH ONCE A DAY   COVID-19 At Home Antigen Test (CARESTART COVID-19 HOME TEST) KIT Use as directed (Patient not taking: Reported on 07/06/2022)   COVID-19 mRNA vaccine 2023-2024 (COMIRNATY) syringe Inject 0.3 mLs into the muscle. (Patient not taking: Reported on 07/06/2022)   influenza vac split quadrivalent PF (FLUARIX QUADRIVALENT) 0.5 ML injection Inject into the muscle. (Patient not taking: Reported on 07/06/2022)   Norethindrone-Ethinyl Estradiol-Fe Biphas (LO LOESTRIN FE) 1 MG-10 MCG / 10 MCG tablet TAKE 1 TABLET BY MOUTH ONCE A DAY   Zoster Vaccine Adjuvanted [facility]) injection Inject into the muscle. (Patient not taking: Reported on 07/06/2022)   Zoster Vaccine Adjuvanted [facility]) injection Inject into the muscle. (Patient not taking: Reported on 07/06/2022)   [DISCONTINUED] amLODipine (NORVASC) 5 MG tablet TAKE 1 TABLET BY MOUTH ONCE DAILY   [DISCONTINUED] azelastine (ASTELIN) 0.1 % nasal spray 1 spray each nostril 1-2 times daily as needed   [DISCONTINUED] Cyanocobalamin (VITAMIN B-12 PO) Take by mouth.   [DISCONTINUED] Estradiol-Progesterone (BIJUVA) 1-100 MG CAPS Take 1 capsule by mouth every evening.   [DISCONTINUED] estrogens, conjugated, (PREMARIN) 0.3 MG tablet Take 1 tablet (0.3 mg total) by mouth daily.   [DISCONTINUED]  estrogens, conjugated, (PREMARIN) 0.45 MG tablet Take 1 tablet (0.45 mg total) by mouth daily.   [DISCONTINUED] estrogens, conjugated, (PREMARIN) 0.625 MG tablet Take 1 tablet (0.625 mg total) by mouth daily.   [DISCONTINUED] famotidine (PEPCID) 20 MG tablet Take 1 tablet (20 mg total) by mouth 2 (two) times daily as needed.   [DISCONTINUED] gabapentin (NEURONTIN) 100 MG capsule TAKE 1  CAPSULE BY MOUTH ONCE A DAY MAY TITRATE UP BY 100MG  EACH WEEK UP TO 900MG    [DISCONTINUED] gabapentin (NEURONTIN) 300 MG capsule Take 1 capsule (300 mg total) by mouth 2 (two) times daily. (Patient not taking: Reported on 07/06/2022)   [DISCONTINUED] gabapentin (NEURONTIN) 300 MG capsule Take 1 capsule (300 mg total) by mouth 2 (two) times daily.   [DISCONTINUED] GNP EVENING PRIMROSE OIL PO Take by mouth.   [DISCONTINUED] guaiFENesin (MUCINEX) 600 MG 12 hr tablet For thick post nasal drainage take 1-2 tablets 2 times daily as needed, use adequate hydration.   [DISCONTINUED] influenza vac split quadrivalent PF (FLUARIX) 0.5 ML injection Inject into the muscle.   [DISCONTINUED] levocetirizine (XYZAL) 5 MG tablet Take 1 tablet (5 mg total) by mouth daily as needed for allergies. (Patient not taking: Reported on 07/06/2022)   [DISCONTINUED] Rhubarb (ESTROVEN COMPLETE PO) Take by mouth.   [DISCONTINUED] traZODone (DESYREL) 50 MG tablet TAKE 1 TABLET BY MOUTH EVERY DAY AT BEDTIME AS NEEDED   [DISCONTINUED] UNABLE TO FIND Med Name: Christiana Fuchs supplement   [DISCONTINUED] venlafaxine (EFFEXOR) 37.5 MG tablet Take 1 tablet (37.5 mg total) by mouth daily with food   No facility-administered encounter medications on file as of 07/06/2022.    Allergies as of 07/06/2022 - Review Complete 07/06/2022  Allergen Reaction Noted   Penicillins Anaphylaxis and Hives    Morphine and related Other (See Comments)     Past Medical History:  Diagnosis Date   High blood pressure    Urticaria     Past Surgical History:  Procedure Laterality Date   ADENOIDECTOMY     BREAST EXCISIONAL BIOPSY Left 2008   [place], [place]   TONSILLECTOMY      Family History  Problem Relation Age of Onset   Varicose Veins Mother    Diabetes Father    Hypertension Father    Varicose Veins Maternal Grandmother     Social History   Socioeconomic History   Marital status: Married    Spouse name: Not on file   Number of  children: Not on file   Years of education: Not on file   Highest education level: Not on file  Occupational History   Not on file  Tobacco Use   Smoking status: Former    Packs/day: 0.25    Years: 11.00    Additional pack years: 0.00    Total pack years: 2.75    Types: Cigarettes    Quit date: 2006    Years since quitting: 18.2   Smokeless tobacco: Never  Vaping Use   Vaping Use: Never used  Substance and Sexual Activity   Alcohol use: Not Currently   Drug use: Not on file   Sexual activity: Not Currently  Other Topics Concern   Not on file  Social History Narrative   Not on file   Social Determinants of Health   Financial Resource Strain: Not on file  Food Insecurity: Not on file  Transportation Needs: Not on file  Physical Activity: Not on file  Stress: Not on file  Social Connections: Not on file  Intimate Partner Violence:  Not on file    Review of Systems  Respiratory:  Positive for cough and shortness of breath.     Vitals:   07/06/22 1531  BP: 102/68  Pulse: 97  SpO2: (!) 83%     Physical Exam Constitutional:      Appearance: Normal appearance.  HENT:     Head: Normocephalic.     Mouth/Throat:     Mouth: Mucous membranes are moist.  Eyes:     General: No scleral icterus.    Pupils: Pupils are equal, round, and reactive to light.  Cardiovascular:     Rate and Rhythm: Normal rate and regular rhythm.     Heart sounds: No murmur heard.    No friction rub.  Pulmonary:     Effort: No respiratory distress.     Breath sounds: No stridor. No wheezing or rhonchi.  Musculoskeletal:     Cervical back: No rigidity or tenderness.  Neurological:     Mental Status: She is alert.  Psychiatric:        Mood and Affect: Mood normal.      Data Reviewed: Recent chest x-ray middle of 2023 shows no acute abnormality  Assessment:  Chronic cough  May be related to airway hyperresponsiveness  Possible underlying asthma although did not have any  significant symptoms prior to having a COVID infection 14 months ago  Demonstrated very good inhaler technique  Plan/Recommendations: Encouraged to continue using Symbicort 80, 2 puffs twice a day and use it regularly for the next few weeks She may give [place] a call to have the Symbicort strident increased to 160  Schedule for pulmonary function test to be performed in the next 4 weeks  Follow-up in 4 weeks  Encouraged to call with significant concerns   Sherrilyn Rist MD [facility] 07/06/2022, 3:59 PM  CC: Clelland, Delrae Rend, PA

## 2022-07-06 NOTE — Patient Instructions (Signed)
I will see you back in about 4 weeks  We will get a breathing study on you the next time you come in  Continue using your Symbicort 2 puffs twice a day  Continue albuterol as needed  Call us with significant concerns  If Symbicort 80 does not seem to be helping, we can increase the dose of the Symbicort

## 2022-07-24 DIAGNOSIS — F341 Dysthymic disorder: Secondary | ICD-10-CM | POA: Diagnosis not present

## 2022-07-24 DIAGNOSIS — F331 Major depressive disorder, recurrent, moderate: Secondary | ICD-10-CM | POA: Diagnosis not present

## 2022-07-24 DIAGNOSIS — F902 Attention-deficit hyperactivity disorder, combined type: Secondary | ICD-10-CM | POA: Diagnosis not present

## 2022-07-24 DIAGNOSIS — F4312 Post-traumatic stress disorder, chronic: Secondary | ICD-10-CM | POA: Diagnosis not present

## 2022-07-31 DIAGNOSIS — F4312 Post-traumatic stress disorder, chronic: Secondary | ICD-10-CM | POA: Diagnosis not present

## 2022-07-31 DIAGNOSIS — F900 Attention-deficit hyperactivity disorder, predominantly inattentive type: Secondary | ICD-10-CM | POA: Diagnosis not present

## 2022-07-31 DIAGNOSIS — F331 Major depressive disorder, recurrent, moderate: Secondary | ICD-10-CM | POA: Diagnosis not present

## 2022-08-07 ENCOUNTER — Encounter (HOSPITAL_BASED_OUTPATIENT_CLINIC_OR_DEPARTMENT_OTHER): Payer: Self-pay

## 2022-08-07 ENCOUNTER — Ambulatory Visit: Payer: BC Managed Care – PPO | Admitting: Pulmonary Disease

## 2022-08-07 ENCOUNTER — Encounter (HOSPITAL_BASED_OUTPATIENT_CLINIC_OR_DEPARTMENT_OTHER): Payer: BC Managed Care – PPO

## 2022-08-14 DIAGNOSIS — F331 Major depressive disorder, recurrent, moderate: Secondary | ICD-10-CM | POA: Diagnosis not present

## 2022-08-14 DIAGNOSIS — F4312 Post-traumatic stress disorder, chronic: Secondary | ICD-10-CM | POA: Diagnosis not present

## 2022-08-14 DIAGNOSIS — F341 Dysthymic disorder: Secondary | ICD-10-CM | POA: Diagnosis not present

## 2022-08-14 DIAGNOSIS — F900 Attention-deficit hyperactivity disorder, predominantly inattentive type: Secondary | ICD-10-CM | POA: Diagnosis not present

## 2022-09-04 DIAGNOSIS — F331 Major depressive disorder, recurrent, moderate: Secondary | ICD-10-CM | POA: Diagnosis not present

## 2022-09-04 DIAGNOSIS — F900 Attention-deficit hyperactivity disorder, predominantly inattentive type: Secondary | ICD-10-CM | POA: Diagnosis not present

## 2022-09-04 DIAGNOSIS — F4312 Post-traumatic stress disorder, chronic: Secondary | ICD-10-CM | POA: Diagnosis not present

## 2022-09-05 DIAGNOSIS — F331 Major depressive disorder, recurrent, moderate: Secondary | ICD-10-CM | POA: Diagnosis not present

## 2022-09-05 DIAGNOSIS — F4312 Post-traumatic stress disorder, chronic: Secondary | ICD-10-CM | POA: Diagnosis not present

## 2022-09-05 DIAGNOSIS — F902 Attention-deficit hyperactivity disorder, combined type: Secondary | ICD-10-CM | POA: Diagnosis not present

## 2022-09-18 ENCOUNTER — Emergency Department (HOSPITAL_COMMUNITY): Payer: No Typology Code available for payment source

## 2022-09-18 ENCOUNTER — Other Ambulatory Visit: Payer: Self-pay

## 2022-09-18 ENCOUNTER — Emergency Department (HOSPITAL_COMMUNITY)
Admission: EM | Admit: 2022-09-18 | Discharge: 2022-09-18 | Disposition: A | Discharge status: Home / Self Care | Payer: No Typology Code available for payment source | Attending: Emergency Medicine | Admitting: Emergency Medicine

## 2022-09-18 ENCOUNTER — Encounter (HOSPITAL_COMMUNITY): Payer: Self-pay

## 2022-09-18 DIAGNOSIS — S6991XA Unspecified injury of right wrist, hand and finger(s), initial encounter: Secondary | ICD-10-CM | POA: Diagnosis not present

## 2022-09-18 DIAGNOSIS — M7989 Other specified soft tissue disorders: Secondary | ICD-10-CM | POA: Insufficient documentation

## 2022-09-18 DIAGNOSIS — M79644 Pain in right finger(s): Secondary | ICD-10-CM | POA: Diagnosis not present

## 2022-09-18 DIAGNOSIS — R2231 Localized swelling, mass and lump, right upper limb: Secondary | ICD-10-CM | POA: Diagnosis not present

## 2022-09-18 MED ORDER — METHYLPREDNISOLONE 4 MG PO TBPK: ORAL_TABLET | ORAL | 0 refills | Status: DC

## 2022-09-18 MED ORDER — IBUPROFEN 800 MG PO TABS: 800.0000 mg | ORAL_TABLET | Freq: Three times a day (TID) | ORAL | 0 refills | Status: DC

## 2022-09-18 NOTE — ED Triage Notes (Signed)
Pt states was moving shopping carts and injured right second digit of hand. The finger has 1+ swelling. Pt unable to bend finger due to swelling.

## 2022-09-18 NOTE — ED Provider Triage Note (Signed)
Emergency Medicine Provider Triage Evaluation Note  Jill Ashley , a 57 y.o. female  was evaluated in triage.  Pt complains of pain to the right index finger.  Finger is mildly swollen.  Patient states she works at [employer].  Believes she injured this while she was moving carts.  No other injuries.  Review of Systems  Positive: As above Negative: As above  Physical Exam  BP 117/79 (BP Location: Left Arm)   Pulse 91   Temp 98.4 F (36.9 C) (Oral)   Resp 20   Ht 5\' 2"  (1.575 m)   Wt 71.1 kg   SpO2 97%   BMI 28.67 kg/m  Gen:   Awake, no distress   Resp:  Normal effort  MSK:   Moves extremities without difficulty  Other:    Medical Decision Making  Medically screening exam initiated at 2:29 PM.  Appropriate orders placed.  Jill Ashley was informed that the remainder of the evaluation will be completed by another provider, this initial triage assessment does not replace that evaluation, and the importance of remaining in the ED until their evaluation is complete.     Marita Kansas, PA-C 09/18/22 1430

## 2022-09-18 NOTE — ED Provider Notes (Signed)
[place] EMERGENCY DEPARTMENT AT [facility] Provider Note   CSN: [id number] Arrival date & time: 09/18/22  1357     History  Chief Complaint  Patient presents with   Hand Pain    Jill Ashley is a 57 y.o. female here with R second finger pain and swelling.  Patient works for [employer].  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  She states that whenever she works several days in a row, she has pain and swelling of the right index finger.  Patient denies any fall or injury.   The history is provided by the patient.       Home Medications Prior to Admission medications   Medication Sig Start Date End Date Taking? Authorizing Provider  albuterol (VENTOLIN HFA) 108 (90 Base) MCG/ACT inhaler SMARTSIG:2 Puff(s) By Mouth Every 4 Hours PRN 06/19/22   [provider]  amLODipine (NORVASC) 5 MG tablet TAKE 1 TABLET BY MOUTH ONCE A DAY 06/23/20 06/23/21  Clelland, Olivia M, PA  Ascorbic Acid (VITAMIN C PO) Take by mouth.    [provider]  BIOTIN PO Take by mouth.    [provider]  budesonide-formoterol (SYMBICORT) 80-4.5 MCG/ACT inhaler SMARTSIG:2 Puff(s) By Mouth Twice Daily 06/20/22   [provider]  buPROPion (WELLBUTRIN SR) 200 MG 12 hr tablet Take 200 mg by mouth every morning. 06/09/22   [provider]  Cholecalciferol (VITAMIN D3 PO) Take by mouth.    [provider]  citalopram (CELEXA) 20 MG tablet Take 20 mg by mouth every morning. 06/26/22   [provider]  COVID-19 At Home Antigen Test [facility] COVID-19 HOME TEST) KIT Use as directed Patient not taking: Reported on 07/06/2022 11/10/20   Driscilla Grammes, [facility]  COVID-19 mRNA vaccine 2023-2024 (COMIRNATY) syringe Inject 0.3 mLs into the muscle. Patient not taking: Reported on 07/06/2022 04/23/22   Judyann Munson, MD  estradiol (VIVELLE-DOT) 0.1 MG/24HR patch 1 patch 2 (two) times a week. 10/24/21   [provider]  influenza vac  split quadrivalent PF (FLUARIX QUADRIVALENT) 0.5 ML injection Inject into the muscle. Patient not taking: Reported on 07/06/2022 01/31/22     ipratropium-albuterol (DUONEB) 0.5-2.5 (3) MG/3ML SOLN SMARTSIG:3 Milliliter(s) Via Nebulizer Every 6 Hours PRN 06/25/22   [provider]  montelukast (SINGULAIR) 10 MG tablet Take 10 mg by mouth daily. 06/19/22   [provider]  Norethindrone-Ethinyl Estradiol-Fe Biphas (LO LOESTRIN FE) 1 MG-10 MCG / 10 MCG tablet TAKE 1 TABLET BY MOUTH ONCE A DAY 03/22/20 03/22/21  Steva Ready, DO  progesterone (PROMETRIUM) 100 MG capsule Take 1 capsule (100 mg total) by mouth daily as directed 10/14/20     traZODone (DESYREL) 50 MG tablet Take 1 tablet (50 mg total) by mouth at bedtime as needed. 09/06/20     Zoster Vaccine Adjuvanted [facility]) injection Inject into the muscle. Patient not taking: Reported on 07/06/2022 07/19/21   Judyann Munson, MD  Zoster Vaccine Adjuvanted [facility]) injection Inject into the muscle. Patient not taking: Reported on 07/06/2022 01/16/22   Judyann Munson, MD      Allergies    Penicillins and Morphine and codeine    Review of Systems   Review of Systems  Musculoskeletal:        Right index finger pain  All other systems reviewed and are negative.   Physical Exam Updated Vital Signs BP 117/79 (BP Location: Left Arm)   Pulse 91   Temp 98.4 F (36.9 C) (Oral)  Resp 20   Ht 5\' 2"  (1.575 m)   Wt 71.1 kg   SpO2 97%   BMI 28.67 kg/m  Physical Exam Vitals and nursing note reviewed.  HENT:     Head: Normocephalic.     Nose: Nose normal.     Mouth/Throat:     Mouth: Mucous membranes are moist.  Eyes:     Extraocular Movements: Extraocular movements intact.     Pupils: Pupils are equal, round, and reactive to light.  Cardiovascular:     Rate and Rhythm: Normal rate.     Pulses: Normal pulses.  Pulmonary:     Effort: Pulmonary effort is normal.     Breath sounds: Normal breath sounds.  Abdominal:      General: Abdomen is flat.     Palpations: Abdomen is soft.  Musculoskeletal:     Cervical back: Normal range of motion.     Comments: Right index finger with swelling of the PIP joint.  Patient is on able to flex the finger.  The finger does not appear warm and there is no signs of infection.   Skin:    Capillary Refill: Capillary refill takes less than 2 seconds.  Neurological:     General: No focal deficit present.     Mental Status: She is alert and oriented to person, place, and time.  Psychiatric:        Mood and Affect: Mood normal.        Behavior: Behavior normal.     ED Results / Procedures / Treatments   Labs (all labs ordered are listed, but only abnormal results are displayed) Labs Reviewed - No data to display  EKG None  Radiology DG Finger Index Right  Result Date: 09/18/2022 CLINICAL DATA:  Injury.  Pain EXAM: RIGHT INDEX FINGER 3V COMPARISON:  None Available. FINDINGS: There is no evidence of fracture or dislocation. There is no evidence of arthropathy or other focal bone abnormality. Soft tissues are unremarkable. IMPRESSION: No acute osseous abnormality. Electronically Signed   By: Karen Kays M.D.   On: 09/18/2022 15:27    Procedures Procedures    Medications Ordered in ED Medications - No data to display  ED Course/ Medical Decision Making/ A&P                             Medical Decision Making Jill Ashley is a 57 y.o. female here with right index finger pain and swelling.  I think this is likely from overuse injury.  I reviewed patient's x-rays and there were no fractures.  Patient was put in a finger static splint for comfort.  Will have patient follow-up with hand surgery.  Recommend light duty until she is able to be cleared by hand surgery   Problems Addressed: Finger swelling: acute illness or injury  Amount and/or Complexity of Data Reviewed Radiology: ordered and independent interpretation performed. Decision-making details documented in  ED Course.    Final Clinical Impression(s) / ED Diagnoses Final diagnoses:  None    Rx / DC Orders ED Discharge Orders     None         Charlynne Pander, MD 09/18/22 1601

## 2022-09-18 NOTE — ED Notes (Signed)
Pt a/a, gcs 15, ambulatory w/ ease, well perfused, well appearing, no signs of distress, vss, ewob, brisk cap refill, mmm, pt acting baseline, deny questions regarding dc/ follow up care. Advised to return if s/s worsen. Finger splint placed by ortho.

## 2022-09-18 NOTE — Progress Notes (Signed)
Orthopedic Tech Progress Note Patient Details:  Jill Ashley 1965/07/03 [id number]  Ortho Devices Type of Ortho Device: Finger splint Ortho Device/Splint Location: LUE Ortho Device/Splint Interventions: Ordered, Application, Adjustment   Post Interventions Patient Tolerated: Well Instructions Provided: Care of device, Adjustment of device  Sherilyn Banker 09/18/2022, 4:57 PM

## 2022-09-18 NOTE — Discharge Instructions (Signed)
You likely have overuse injury.  Your x-rays did not show a fracture  Please take Medrol Dosepak, which are some steroids, to help with the swelling  Please use the finger splint for comfort  Please take ibuprofen for pain  As we discussed, I recommend light duty until you are cleared by hand surgery.  Please call the hand surgeon, Dr. Merlyn Lot for appointment  Return to ER if you have worse hand pain, finger swelling.

## 2022-10-16 DIAGNOSIS — M5451 Vertebrogenic low back pain: Secondary | ICD-10-CM | POA: Diagnosis not present

## 2022-10-16 DIAGNOSIS — M9905 Segmental and somatic dysfunction of pelvic region: Secondary | ICD-10-CM | POA: Diagnosis not present

## 2022-10-16 DIAGNOSIS — M9903 Segmental and somatic dysfunction of lumbar region: Secondary | ICD-10-CM | POA: Diagnosis not present

## 2022-10-16 DIAGNOSIS — M9904 Segmental and somatic dysfunction of sacral region: Secondary | ICD-10-CM | POA: Diagnosis not present

## 2022-10-22 DIAGNOSIS — M5136 Other intervertebral disc degeneration, lumbar region: Secondary | ICD-10-CM | POA: Diagnosis not present

## 2022-10-22 DIAGNOSIS — M9904 Segmental and somatic dysfunction of sacral region: Secondary | ICD-10-CM | POA: Diagnosis not present

## 2022-10-23 DIAGNOSIS — M9904 Segmental and somatic dysfunction of sacral region: Secondary | ICD-10-CM | POA: Diagnosis not present

## 2022-10-23 DIAGNOSIS — M5451 Vertebrogenic low back pain: Secondary | ICD-10-CM | POA: Diagnosis not present

## 2022-10-23 DIAGNOSIS — M9905 Segmental and somatic dysfunction of pelvic region: Secondary | ICD-10-CM | POA: Diagnosis not present

## 2022-10-23 DIAGNOSIS — M9903 Segmental and somatic dysfunction of lumbar region: Secondary | ICD-10-CM | POA: Diagnosis not present

## 2022-10-25 DIAGNOSIS — N898 Other specified noninflammatory disorders of vagina: Secondary | ICD-10-CM | POA: Diagnosis not present

## 2022-10-25 DIAGNOSIS — N951 Menopausal and female climacteric states: Secondary | ICD-10-CM | POA: Diagnosis not present

## 2022-10-25 DIAGNOSIS — Z79890 Hormone replacement therapy (postmenopausal): Secondary | ICD-10-CM | POA: Diagnosis not present

## 2022-10-25 DIAGNOSIS — Z01419 Encounter for gynecological examination (general) (routine) without abnormal findings: Secondary | ICD-10-CM | POA: Diagnosis not present

## 2022-12-04 DIAGNOSIS — F332 Major depressive disorder, recurrent severe without psychotic features: Secondary | ICD-10-CM | POA: Diagnosis not present

## 2022-12-11 ENCOUNTER — Other Ambulatory Visit: Payer: Self-pay | Admitting: Obstetrics and Gynecology

## 2022-12-11 DIAGNOSIS — Z1231 Encounter for screening mammogram for malignant neoplasm of breast: Secondary | ICD-10-CM

## 2022-12-19 ENCOUNTER — Other Ambulatory Visit: Payer: Self-pay | Admitting: Nurse Practitioner

## 2022-12-19 DIAGNOSIS — N644 Mastodynia: Secondary | ICD-10-CM

## 2022-12-26 DIAGNOSIS — E7800 Pure hypercholesterolemia, unspecified: Secondary | ICD-10-CM | POA: Diagnosis not present

## 2022-12-26 DIAGNOSIS — J4520 Mild intermittent asthma, uncomplicated: Secondary | ICD-10-CM | POA: Diagnosis not present

## 2022-12-26 DIAGNOSIS — F4321 Adjustment disorder with depressed mood: Secondary | ICD-10-CM | POA: Diagnosis not present

## 2022-12-26 DIAGNOSIS — I10 Essential (primary) hypertension: Secondary | ICD-10-CM | POA: Diagnosis not present

## 2023-01-14 ENCOUNTER — Other Ambulatory Visit (HOSPITAL_BASED_OUTPATIENT_CLINIC_OR_DEPARTMENT_OTHER): Payer: Self-pay

## 2023-01-14 MED ORDER — INFLUENZA VIRUS VACC SPLIT PF (FLUZONE) 0.5 ML IM SUSY
0.5000 mL | PREFILLED_SYRINGE | Freq: Once | INTRAMUSCULAR | 0 refills | Status: AC
  Filled 2023-01-14: qty 0.5, 1d supply, fill #0

## 2023-01-14 MED ORDER — COVID-19 MRNA VAC-TRIS(PFIZER) 30 MCG/0.3ML IM SUSY
0.3000 mL | PREFILLED_SYRINGE | Freq: Once | INTRAMUSCULAR | 0 refills | Status: AC
  Filled 2023-01-14: qty 0.3, 1d supply, fill #0

## 2023-01-23 ENCOUNTER — Ambulatory Visit
Admission: RE | Admit: 2023-01-23 | Discharge: 2023-01-23 | Disposition: A | Discharge status: Home / Self Care | Payer: BC Managed Care – PPO | Source: Ambulatory Visit | Attending: Nurse Practitioner | Admitting: Nurse Practitioner

## 2023-01-23 ENCOUNTER — Ambulatory Visit
Admission: RE | Admit: 2023-01-23 | Discharge: 2023-01-23 | Disposition: A | Discharge status: Home / Self Care | Payer: BC Managed Care – PPO | Source: Ambulatory Visit | Attending: Nurse Practitioner

## 2023-01-23 DIAGNOSIS — N644 Mastodynia: Secondary | ICD-10-CM | POA: Diagnosis not present

## 2023-02-12 IMAGING — DX DG HIP (WITH OR WITHOUT PELVIS) 2-3V*L*
3 series · 3 of 3 positions shown · non-contrast
Comparison: None.

CLINICAL DATA: Left hip pain.  No known injury.

EXAM:
DG HIP (WITH OR WITHOUT PELVIS) 2-3V LEFT

[pelvis ap]
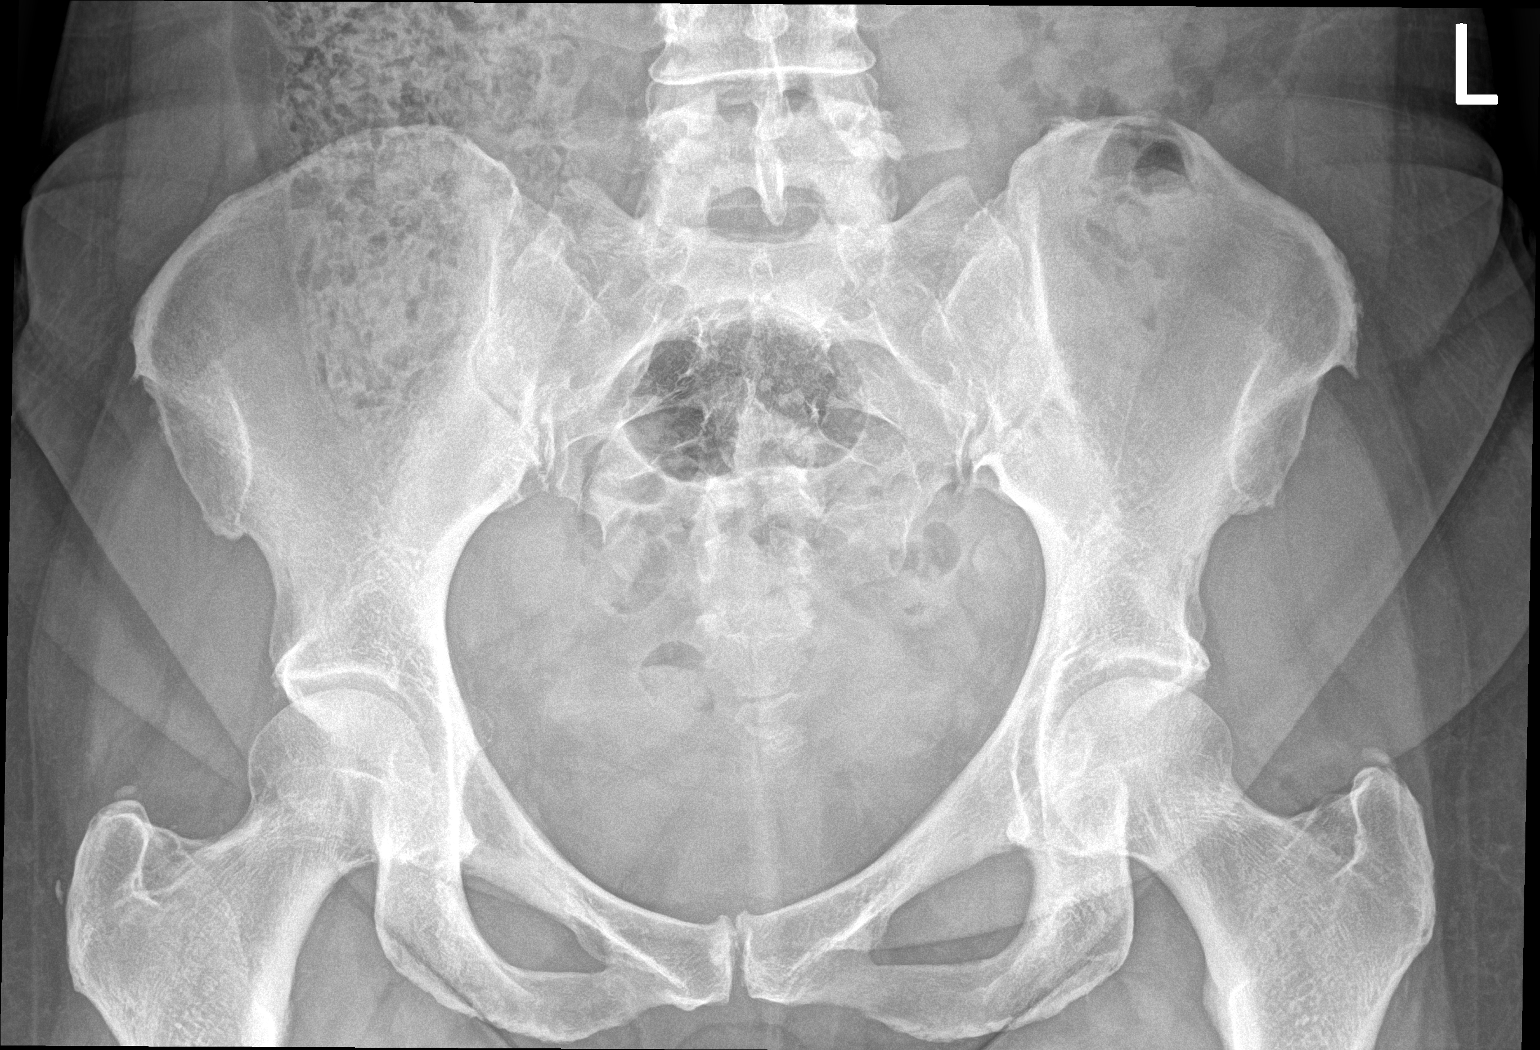

[hip ap]
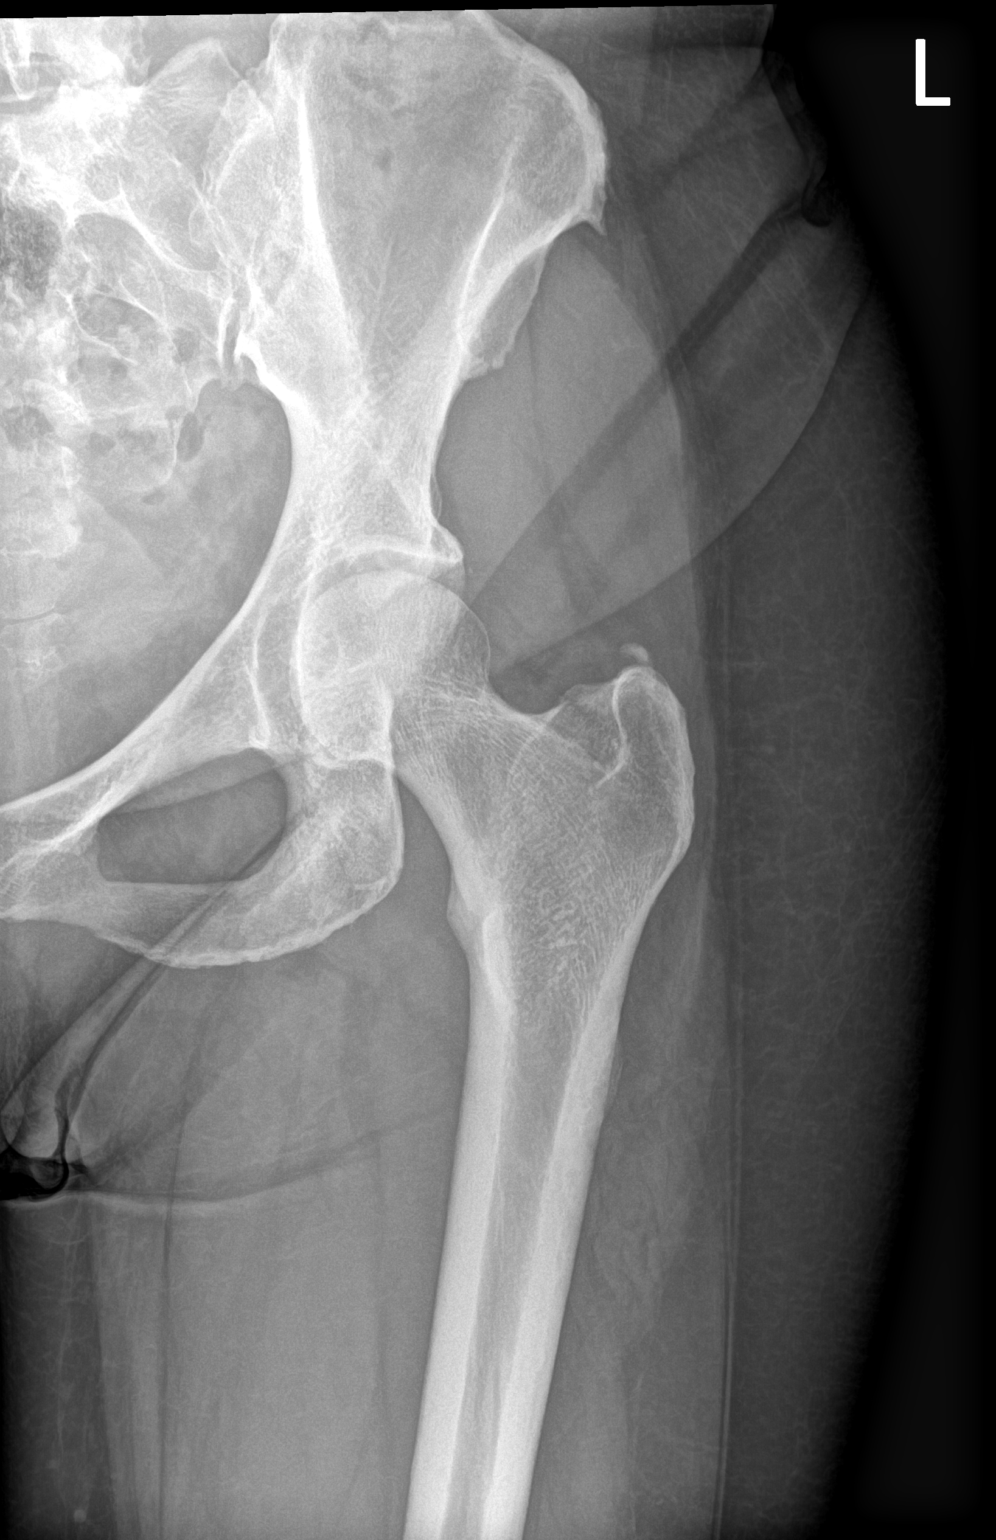

[hip frog leg]
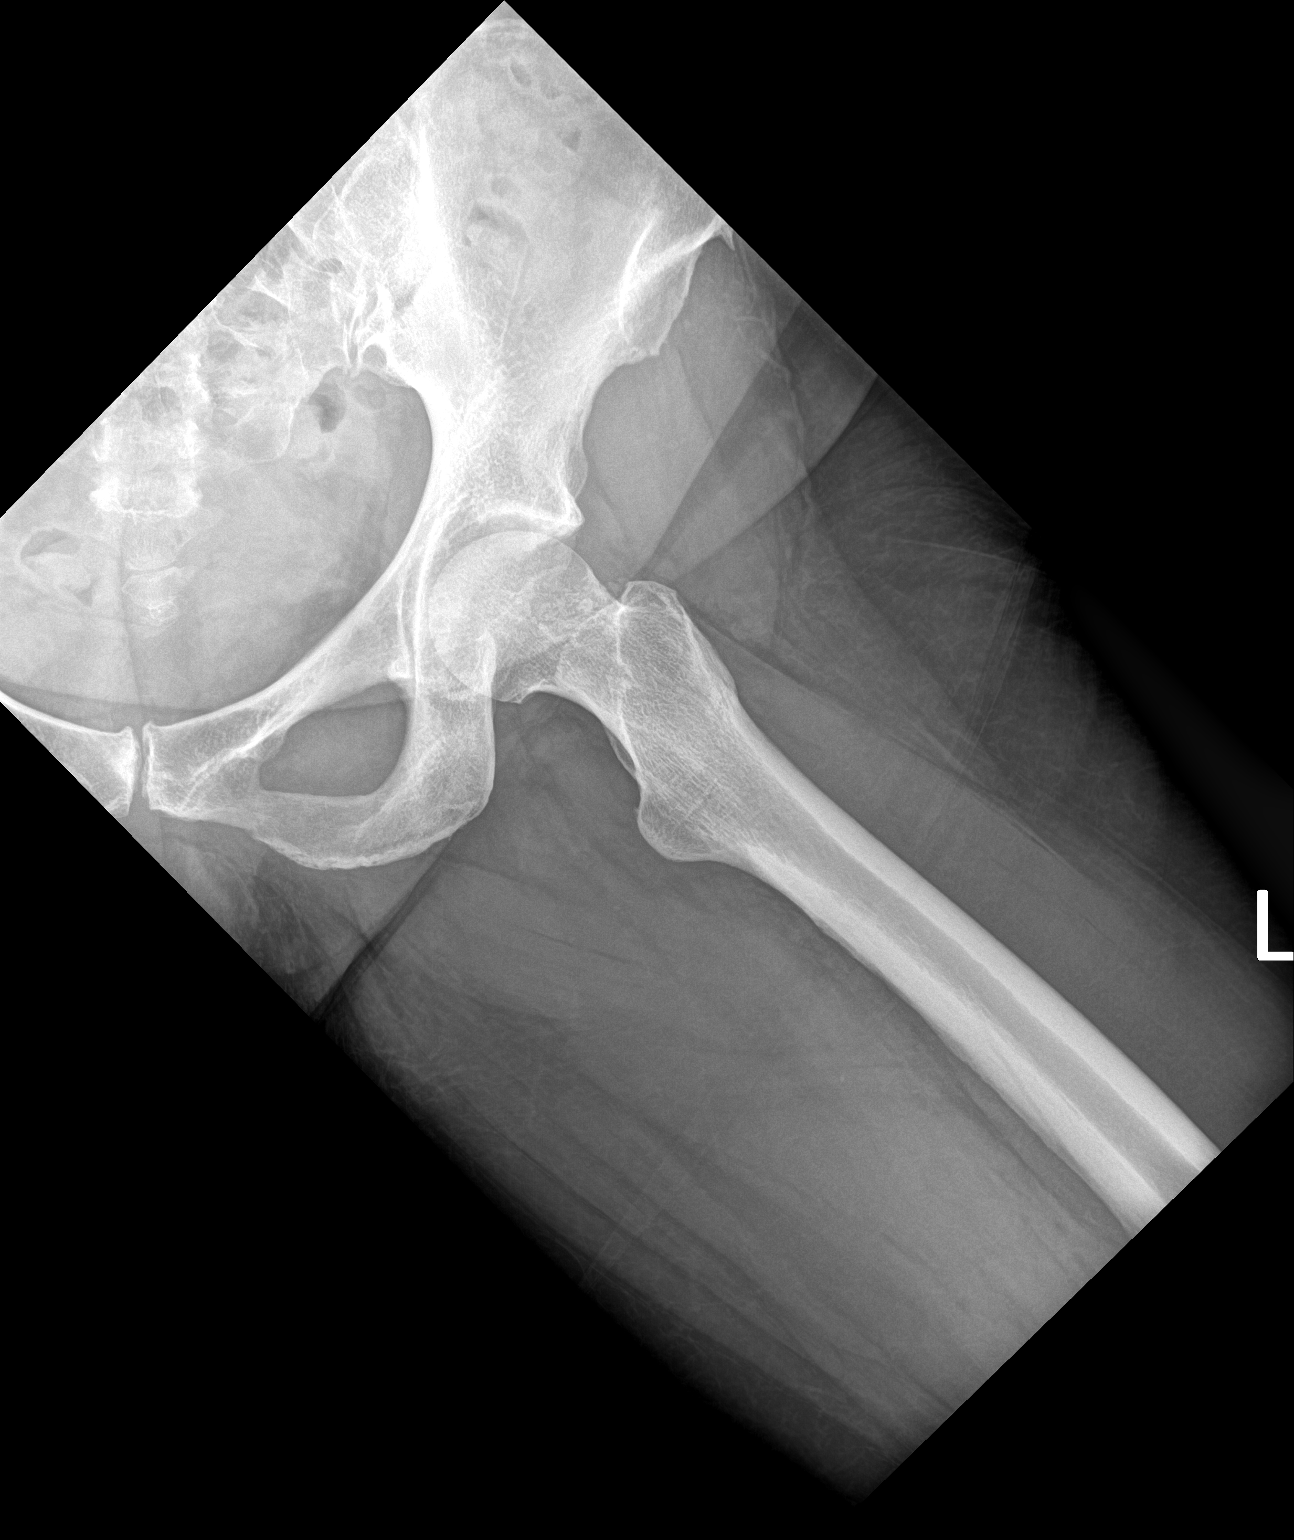

[3 of 3 positions shown; findings below may reference images not displayed]

FINDINGS: There is no evidence of hip fracture or dislocation. Joint spaces
are maintained. There are small soft tissue calcifications adjacent
to the greater trochanters bilaterally which may be related to old
injury or chronic repetitive stress injury. There are mild
degenerative changes of the pubic symphysis. The soft tissues are
within normal limits.
IMPRESSION: 1. No acute bony abnormality.

## 2023-04-30 DIAGNOSIS — F4312 Post-traumatic stress disorder, chronic: Secondary | ICD-10-CM | POA: Diagnosis not present

## 2023-04-30 DIAGNOSIS — F332 Major depressive disorder, recurrent severe without psychotic features: Secondary | ICD-10-CM | POA: Diagnosis not present

## 2023-04-30 DIAGNOSIS — F902 Attention-deficit hyperactivity disorder, combined type: Secondary | ICD-10-CM | POA: Diagnosis not present

## 2023-05-29 DIAGNOSIS — F332 Major depressive disorder, recurrent severe without psychotic features: Secondary | ICD-10-CM | POA: Diagnosis not present

## 2023-05-29 DIAGNOSIS — F902 Attention-deficit hyperactivity disorder, combined type: Secondary | ICD-10-CM | POA: Diagnosis not present

## 2023-05-29 DIAGNOSIS — F4312 Post-traumatic stress disorder, chronic: Secondary | ICD-10-CM | POA: Diagnosis not present

## 2023-06-04 DIAGNOSIS — F332 Major depressive disorder, recurrent severe without psychotic features: Secondary | ICD-10-CM | POA: Diagnosis not present

## 2023-06-04 DIAGNOSIS — F902 Attention-deficit hyperactivity disorder, combined type: Secondary | ICD-10-CM | POA: Diagnosis not present

## 2023-06-04 DIAGNOSIS — F4312 Post-traumatic stress disorder, chronic: Secondary | ICD-10-CM | POA: Diagnosis not present

## 2023-06-11 DIAGNOSIS — F332 Major depressive disorder, recurrent severe without psychotic features: Secondary | ICD-10-CM | POA: Diagnosis not present

## 2023-06-11 DIAGNOSIS — F902 Attention-deficit hyperactivity disorder, combined type: Secondary | ICD-10-CM | POA: Diagnosis not present

## 2023-06-11 DIAGNOSIS — F4312 Post-traumatic stress disorder, chronic: Secondary | ICD-10-CM | POA: Diagnosis not present

## 2023-06-11 DIAGNOSIS — F341 Dysthymic disorder: Secondary | ICD-10-CM | POA: Diagnosis not present

## 2023-06-18 DIAGNOSIS — F332 Major depressive disorder, recurrent severe without psychotic features: Secondary | ICD-10-CM | POA: Diagnosis not present

## 2023-06-18 DIAGNOSIS — F341 Dysthymic disorder: Secondary | ICD-10-CM | POA: Diagnosis not present

## 2023-06-18 DIAGNOSIS — F4312 Post-traumatic stress disorder, chronic: Secondary | ICD-10-CM | POA: Diagnosis not present

## 2023-06-18 DIAGNOSIS — F902 Attention-deficit hyperactivity disorder, combined type: Secondary | ICD-10-CM | POA: Diagnosis not present

## 2023-06-25 DIAGNOSIS — Z0000 Encounter for general adult medical examination without abnormal findings: Secondary | ICD-10-CM | POA: Diagnosis not present

## 2023-06-25 DIAGNOSIS — Z23 Encounter for immunization: Secondary | ICD-10-CM | POA: Diagnosis not present

## 2023-06-25 DIAGNOSIS — I10 Essential (primary) hypertension: Secondary | ICD-10-CM | POA: Diagnosis not present

## 2023-06-25 DIAGNOSIS — E7800 Pure hypercholesterolemia, unspecified: Secondary | ICD-10-CM | POA: Diagnosis not present

## 2023-06-25 DIAGNOSIS — R053 Chronic cough: Secondary | ICD-10-CM | POA: Diagnosis not present

## 2023-06-26 DIAGNOSIS — F902 Attention-deficit hyperactivity disorder, combined type: Secondary | ICD-10-CM | POA: Diagnosis not present

## 2023-06-26 DIAGNOSIS — F4312 Post-traumatic stress disorder, chronic: Secondary | ICD-10-CM | POA: Diagnosis not present

## 2023-06-26 DIAGNOSIS — F332 Major depressive disorder, recurrent severe without psychotic features: Secondary | ICD-10-CM | POA: Diagnosis not present

## 2023-07-02 DIAGNOSIS — F332 Major depressive disorder, recurrent severe without psychotic features: Secondary | ICD-10-CM | POA: Diagnosis not present

## 2023-07-02 DIAGNOSIS — F902 Attention-deficit hyperactivity disorder, combined type: Secondary | ICD-10-CM | POA: Diagnosis not present

## 2023-07-02 DIAGNOSIS — F4312 Post-traumatic stress disorder, chronic: Secondary | ICD-10-CM | POA: Diagnosis not present

## 2023-07-02 DIAGNOSIS — F341 Dysthymic disorder: Secondary | ICD-10-CM | POA: Diagnosis not present

## 2023-07-21 ENCOUNTER — Other Ambulatory Visit: Payer: Self-pay

## 2023-07-21 ENCOUNTER — Emergency Department (HOSPITAL_BASED_OUTPATIENT_CLINIC_OR_DEPARTMENT_OTHER)
Admission: EM | Admit: 2023-07-21 | Discharge: 2023-07-21 | Disposition: A | Discharge status: Home / Self Care | Attending: Emergency Medicine | Admitting: Emergency Medicine

## 2023-07-21 ENCOUNTER — Encounter (HOSPITAL_BASED_OUTPATIENT_CLINIC_OR_DEPARTMENT_OTHER): Payer: Self-pay | Admitting: Emergency Medicine

## 2023-07-21 ENCOUNTER — Emergency Department (HOSPITAL_BASED_OUTPATIENT_CLINIC_OR_DEPARTMENT_OTHER)

## 2023-07-21 DIAGNOSIS — R0602 Shortness of breath: Secondary | ICD-10-CM | POA: Diagnosis not present

## 2023-07-21 DIAGNOSIS — J45909 Unspecified asthma, uncomplicated: Secondary | ICD-10-CM | POA: Insufficient documentation

## 2023-07-21 DIAGNOSIS — R0789 Other chest pain: Secondary | ICD-10-CM | POA: Insufficient documentation

## 2023-07-21 DIAGNOSIS — Z7951 Long term (current) use of inhaled steroids: Secondary | ICD-10-CM | POA: Diagnosis not present

## 2023-07-21 DIAGNOSIS — R059 Cough, unspecified: Secondary | ICD-10-CM | POA: Insufficient documentation

## 2023-07-21 DIAGNOSIS — R079 Chest pain, unspecified: Secondary | ICD-10-CM | POA: Diagnosis not present

## 2023-07-21 LAB — CBC
HCT: 37.9 % (ref 36.0–46.0)
Hemoglobin: 12.8 g/dL (ref 12.0–15.0)
MCH: 30 pg (ref 26.0–34.0)
MCHC: 33.8 g/dL (ref 30.0–36.0)
MCV: 88.8 fL (ref 80.0–100.0)
Platelets: 236 10*3/uL (ref 150–400)
RBC: 4.27 MIL/uL (ref 3.87–5.11)
RDW: 13.4 % (ref 11.5–15.5)
WBC: 5.2 10*3/uL (ref 4.0–10.5)
nRBC: 0 % (ref 0.0–0.2)

## 2023-07-21 LAB — BASIC METABOLIC PANEL WITH GFR
Anion gap: 8 (ref 5–15)
BUN: 18 mg/dL (ref 6–20)
CO2: 25 mmol/L (ref 22–32)
Calcium: 9.4 mg/dL (ref 8.9–10.3)
Chloride: 103 mmol/L (ref 98–111)
Creatinine, Ser: 0.78 mg/dL (ref 0.44–1.00)
GFR, Estimated: >60 mL/min (ref >60)
Glucose, Bld: 103 mg/dL — ABNORMAL HIGH (ref 70–99)
Potassium: 3.7 mmol/L (ref 3.5–5.1)
Sodium: 136 mmol/L (ref 135–145)

## 2023-07-21 LAB — TROPONIN I (HIGH SENSITIVITY): Troponin I (High Sensitivity): <2 ng/L (ref <18)

## 2023-07-21 MED ORDER — ALBUTEROL SULFATE HFA 108 (90 BASE) MCG/ACT IN AERS: 2.0000 | INHALATION_SPRAY | RESPIRATORY_TRACT | Status: DC | PRN

## 2023-07-21 MED ORDER — DEXAMETHASONE SODIUM PHOSPHATE 10 MG/ML IJ SOLN
10.0000 mg | Freq: Once | INTRAMUSCULAR | Status: DC
  Filled 2023-07-21: qty 1

## 2023-07-21 MED ORDER — ALBUTEROL SULFATE (2.5 MG/3ML) 0.083% IN NEBU
5.0000 mg | INHALATION_SOLUTION | Freq: Once | RESPIRATORY_TRACT | Status: AC
  Administered 2023-07-21: 5 mg via RESPIRATORY_TRACT
  Filled 2023-07-21: qty 6

## 2023-07-21 MED ORDER — IPRATROPIUM BROMIDE 0.02 % IN SOLN
0.5000 mg | Freq: Once | RESPIRATORY_TRACT | Status: AC
  Administered 2023-07-21: 0.5 mg via RESPIRATORY_TRACT
  Filled 2023-07-21: qty 2.5

## 2023-07-21 MED ORDER — IPRATROPIUM-ALBUTEROL 0.5-2.5 (3) MG/3ML IN SOLN: 3.0000 mL | Freq: Four times a day (QID) | RESPIRATORY_TRACT | 0 refills | Status: DC | PRN

## 2023-07-21 NOTE — ED Triage Notes (Signed)
 Pt via pov from home with sob and coughing this afternoon. Pt has hx of asthma, states this is a little different, that her chest is burning. She has been coughing today; states she has had some nasal drainage over a couple of days.A&O x 4; nad noted.

## 2023-07-21 NOTE — ED Notes (Signed)
 Patient ambulated with pulse ox w/o issue. SpO2 remained 99-100% on RA. Patient denies difficulty breathing. Intermittently getting into coughing fits while ambulating.

## 2023-07-21 NOTE — Discharge Instructions (Signed)
 Use your regular medications to control of symptoms.   Please return to the ED if your symptoms worsen or for new concern. Call the [facility] Allergy office as referred above to schedule a time to be seen for further care.

## 2023-07-21 NOTE — ED Provider Notes (Signed)
 [facility] AT [facility] Provider Note   CSN: [id number] Arrival date & time: 07/21/23  1912     History  Chief Complaint  Patient presents with   Shortness of Breath    Jill Ashley is a 58 y.o. female.  Patient to ED with progressive symptoms of allergies described as SOB, uncontrollable cough and chest tightness. No fever. She is prescribed Symbicort, Singulair, albuterol inhaler and DuoNeb for nebulizer. She states she takes the Singulair daily and uses both her albuterol inhalers and nebulized medication, but does not use the Symbicort daily.   The history is provided by the patient and the spouse. No language interpreter was used.  Shortness of Breath      Home Medications Prior to Admission medications   Medication Sig Start Date End Date Taking? Authorizing Provider  albuterol (VENTOLIN HFA) 108 (90 Base) MCG/ACT inhaler SMARTSIG:2 Puff(s) By Mouth Every 4 Hours PRN 06/19/22   [provider]  amLODipine (NORVASC) 5 MG tablet TAKE 1 TABLET BY MOUTH ONCE A DAY 06/23/20 06/23/21  Clelland, Olivia M, PA  Ascorbic Acid (VITAMIN C PO) Take by mouth.    [provider]  BIOTIN PO Take by mouth.    [provider]  budesonide-formoterol (SYMBICORT) 80-4.5 MCG/ACT inhaler SMARTSIG:2 Puff(s) By Mouth Twice Daily 06/20/22   [provider]  buPROPion (WELLBUTRIN SR) 200 MG 12 hr tablet Take 200 mg by mouth every morning. 06/09/22   [provider]  Cholecalciferol (VITAMIN D3 PO) Take by mouth.    [provider]  citalopram (CELEXA) 20 MG tablet Take 20 mg by mouth every morning. 06/26/22   [provider]  COVID-19 At Home Antigen Test [facility] COVID-19 HOME TEST) KIT Use as directed Patient not taking: Reported on 07/06/2022 11/10/20   Driscilla Grammes, [facility]  COVID-19 mRNA vaccine 2023-2024 (COMIRNATY) syringe Inject 0.3 mLs into the muscle. Patient not taking: Reported on 07/06/2022 04/23/22    Judyann Munson, MD  estradiol (VIVELLE-DOT) 0.1 MG/24HR patch 1 patch 2 (two) times a week. 10/24/21   [provider]  ibuprofen (ADVIL) 800 MG tablet Take 1 tablet (800 mg total) by mouth 3 (three) times daily. 09/18/22   Charlynne Pander, MD  influenza vac split quadrivalent PF (FLUARIX QUADRIVALENT) 0.5 ML injection Inject into the muscle. Patient not taking: Reported on 07/06/2022 01/31/22     ipratropium-albuterol (DUONEB) 0.5-2.5 (3) MG/3ML SOLN Take 3 mLs by nebulization every 6 (six) hours as needed. 07/21/23   Elpidio Anis, PA-C  methylPREDNISolone (MEDROL DOSEPAK) 4 MG TBPK tablet Use as directed 09/18/22   Charlynne Pander, MD  montelukast (SINGULAIR) 10 MG tablet Take 10 mg by mouth daily. 06/19/22   [provider]  Norethindrone-Ethinyl Estradiol-Fe Biphas (LO LOESTRIN FE) 1 MG-10 MCG / 10 MCG tablet TAKE 1 TABLET BY MOUTH ONCE A DAY 03/22/20 03/22/21  Steva Ready, DO  progesterone (PROMETRIUM) 100 MG capsule Take 1 capsule (100 mg total) by mouth daily as directed 10/14/20     traZODone (DESYREL) 50 MG tablet Take 1 tablet (50 mg total) by mouth at bedtime as needed. 09/06/20     Zoster Vaccine Adjuvanted [facility]) injection Inject into the muscle. Patient not taking: Reported on 07/06/2022 07/19/21   Judyann Munson, MD  Zoster Vaccine Adjuvanted [facility]) injection Inject into the muscle. Patient not taking: Reported on 07/06/2022 01/16/22   Judyann Munson, MD      Allergies    Penicillins and Morphine and codeine  Review of Systems   Review of Systems  Respiratory:  Positive for shortness of breath.     Physical Exam Updated Vital Signs BP 112/64   Pulse 72   Temp 98.4 F (36.9 C) (Oral)   Resp 18   Ht 5\' 2"  (1.575 m)   Wt 71.2 kg   SpO2 98%   BMI 28.72 kg/m  Physical Exam Constitutional:      Appearance: She is well-developed.  HENT:     Head: Normocephalic.  Cardiovascular:     Rate and Rhythm: Normal rate and regular rhythm.     Heart  sounds: No murmur heard. Pulmonary:     Effort: Pulmonary effort is normal.     Breath sounds: Normal breath sounds. No wheezing, rhonchi or rales.     Comments: Actively coughing during exam, infrequent.  Abdominal:     Palpations: Abdomen is soft.  Musculoskeletal:        General: Normal range of motion.     Cervical back: Normal range of motion and neck supple.  Skin:    General: Skin is warm and dry.  Neurological:     General: No focal deficit present.     Mental Status: She is alert and oriented to person, place, and time.     ED Results / Procedures / Treatments   Labs (all labs ordered are listed, but only abnormal results are displayed) Labs Reviewed  BASIC METABOLIC PANEL WITH GFR - Abnormal; Notable for the following components:      Result Value   Glucose, Bld 103 (*)    All other components within normal limits  CBC  TROPONIN I (HIGH SENSITIVITY)   Results for orders placed or performed during the hospital encounter of 07/21/23  Basic metabolic panel   Collection Time: 07/21/23  8:42 PM  Result Value Ref Range   Sodium 136 135 - 145 mmol/L   Potassium 3.7 3.5 - 5.1 mmol/L   Chloride 103 98 - 111 mmol/L   CO2 25 22 - 32 mmol/L   Glucose, Bld 103 (H) 70 - 99 mg/dL   BUN 18 6 - 20 mg/dL   Creatinine, Ser 2.13 0.44 - 1.00 mg/dL   Calcium 9.4 8.9 - 08.6 mg/dL   GFR, Estimated >57 >84 mL/min   Anion gap 8 5 - 15  CBC   Collection Time: 07/21/23  8:42 PM  Result Value Ref Range   WBC 5.2 4.0 - 10.5 K/uL   RBC 4.27 3.87 - 5.11 MIL/uL   Hemoglobin 12.8 12.0 - 15.0 g/dL   HCT 69.6 29.5 - 28.4 %   MCV 88.8 80.0 - 100.0 fL   MCH 30.0 26.0 - 34.0 pg   MCHC 33.8 30.0 - 36.0 g/dL   RDW 13.2 44.0 - 10.2 %   Platelets 236 150 - 400 K/uL   nRBC 0.0 0.0 - 0.2 %  Troponin I (High Sensitivity)   Collection Time: 07/21/23  8:42 PM  Result Value Ref Range   Troponin I (High Sensitivity) <2 <18 ng/L    EKG None  Radiology DG Chest Port 1 View Result Date:  07/21/2023 CLINICAL DATA:  Shortness of breath and cough with chest burning. EXAM: PORTABLE CHEST 1 VIEW COMPARISON:  September 28, 2021 FINDINGS: The heart size and mediastinal contours are within normal limits. Both lungs are clear. Multilevel degenerative changes seen throughout the thoracic spine. IMPRESSION: No active disease. Electronically Signed   By: Aram Candela M.D.   On: 07/21/2023 20:14  Procedures Procedures    Medications Ordered in ED Medications  dexamethasone (DECADRON) injection 10 mg (10 mg Intravenous Patient Refused/Not Given 07/21/23 2150)  albuterol (PROVENTIL) (2.5 MG/3ML) 0.083% nebulizer solution 5 mg (5 mg Nebulization Given 07/21/23 2138)  ipratropium (ATROVENT) nebulizer solution 0.5 mg (0.5 mg Nebulization Given 07/21/23 2139)    ED Course/ Medical Decision Making/ A&P Clinical Course as of 07/21/23 2249  Sun Jul 21, 2023  2234 Patient with history of asthma and allergies presents with SOB and cough c/w previous symptoms but worse/uncontrolled. CXR clear. Labs reassuring. No hypoxia or tachycardia. She received a DuoNeb here and feels a bit better. She is ambulated through the department and notes less coughing than earlier, no hypoxia with ambulation. Decadron was ordered but declined because she has unwanted side effects with steroid use. She is stable for discharge home. Will refer to allergist. Return precautions discussed.  [SU]    Clinical Course User Index [SU] Elpidio Anis, PA-C                                 Medical Decision Making Amount and/or Complexity of Data Reviewed Labs: ordered. Radiology: ordered.  Risk Prescription drug management.           Final Clinical Impression(s) / ED Diagnoses Final diagnoses:  Cough, unspecified type    Rx / DC Orders ED Discharge Orders          Ordered    ipratropium-albuterol (DUONEB) 0.5-2.5 (3) MG/3ML SOLN  Every 6 hours PRN        07/21/23 2237              Elpidio Anis,  PA-C 07/21/23 2249    Vanetta Mulders, MD 07/25/23 1744

## 2023-07-21 NOTE — ED Notes (Signed)
 Pt seen in triage by this RT. Breath sounds clear, pt with dry cough, SATS 96% on room air. No distress noted.

## 2023-07-26 ENCOUNTER — Ambulatory Visit: Payer: Self-pay

## 2023-07-26 ENCOUNTER — Telehealth: Payer: Self-pay

## 2023-07-26 DIAGNOSIS — F332 Major depressive disorder, recurrent severe without psychotic features: Secondary | ICD-10-CM | POA: Diagnosis not present

## 2023-07-26 NOTE — Telephone Encounter (Signed)
 Copied from CRM [id number]. Topic: General - Call Back - No Documentation >> Jul 26, 2023  8:28 AM Adele Barthel wrote: Reason for CRM:   Patient is returning call from [place] to schedule an New patient appt. No avail in the [place] office, so trying to schedule her with [place] office. Contacted [facility], but no answer. Requests call back.  #[phone]   Please schedule pt appt

## 2023-07-26 NOTE — Telephone Encounter (Signed)
 This Triage RN attempted to contact this patient at this time. No answer on this attempt. Unable to leave a voicemail.

## 2023-07-26 NOTE — Telephone Encounter (Signed)
 This Triage RN attempted to contact the patient at this time, no answer, unable to leave a voicemail.

## 2023-07-29 NOTE — Telephone Encounter (Signed)
 LM for PT. She is not a new PT. Can be seen by Nurse Practitioner at any office. Will send [facility] message also.

## 2023-07-30 ENCOUNTER — Encounter: Payer: Self-pay | Admitting: Pulmonary Disease

## 2023-07-30 NOTE — Telephone Encounter (Signed)
 Left 3rd msg for PT to call for appt. Will send letter thru post office.

## 2023-07-31 DIAGNOSIS — F332 Major depressive disorder, recurrent severe without psychotic features: Secondary | ICD-10-CM | POA: Diagnosis not present

## 2023-08-01 DIAGNOSIS — F332 Major depressive disorder, recurrent severe without psychotic features: Secondary | ICD-10-CM | POA: Diagnosis not present

## 2023-08-02 DIAGNOSIS — F332 Major depressive disorder, recurrent severe without psychotic features: Secondary | ICD-10-CM | POA: Diagnosis not present

## 2023-08-06 DIAGNOSIS — F332 Major depressive disorder, recurrent severe without psychotic features: Secondary | ICD-10-CM | POA: Diagnosis not present

## 2023-08-06 IMAGING — DX DG CHEST 2V
2 series · 2 of 2 positions shown · non-contrast
Comparison: None Available.

CLINICAL DATA: Chronic cough

EXAM:
CHEST - 2 VIEW

[dg chest 2 view (1 of 2)]
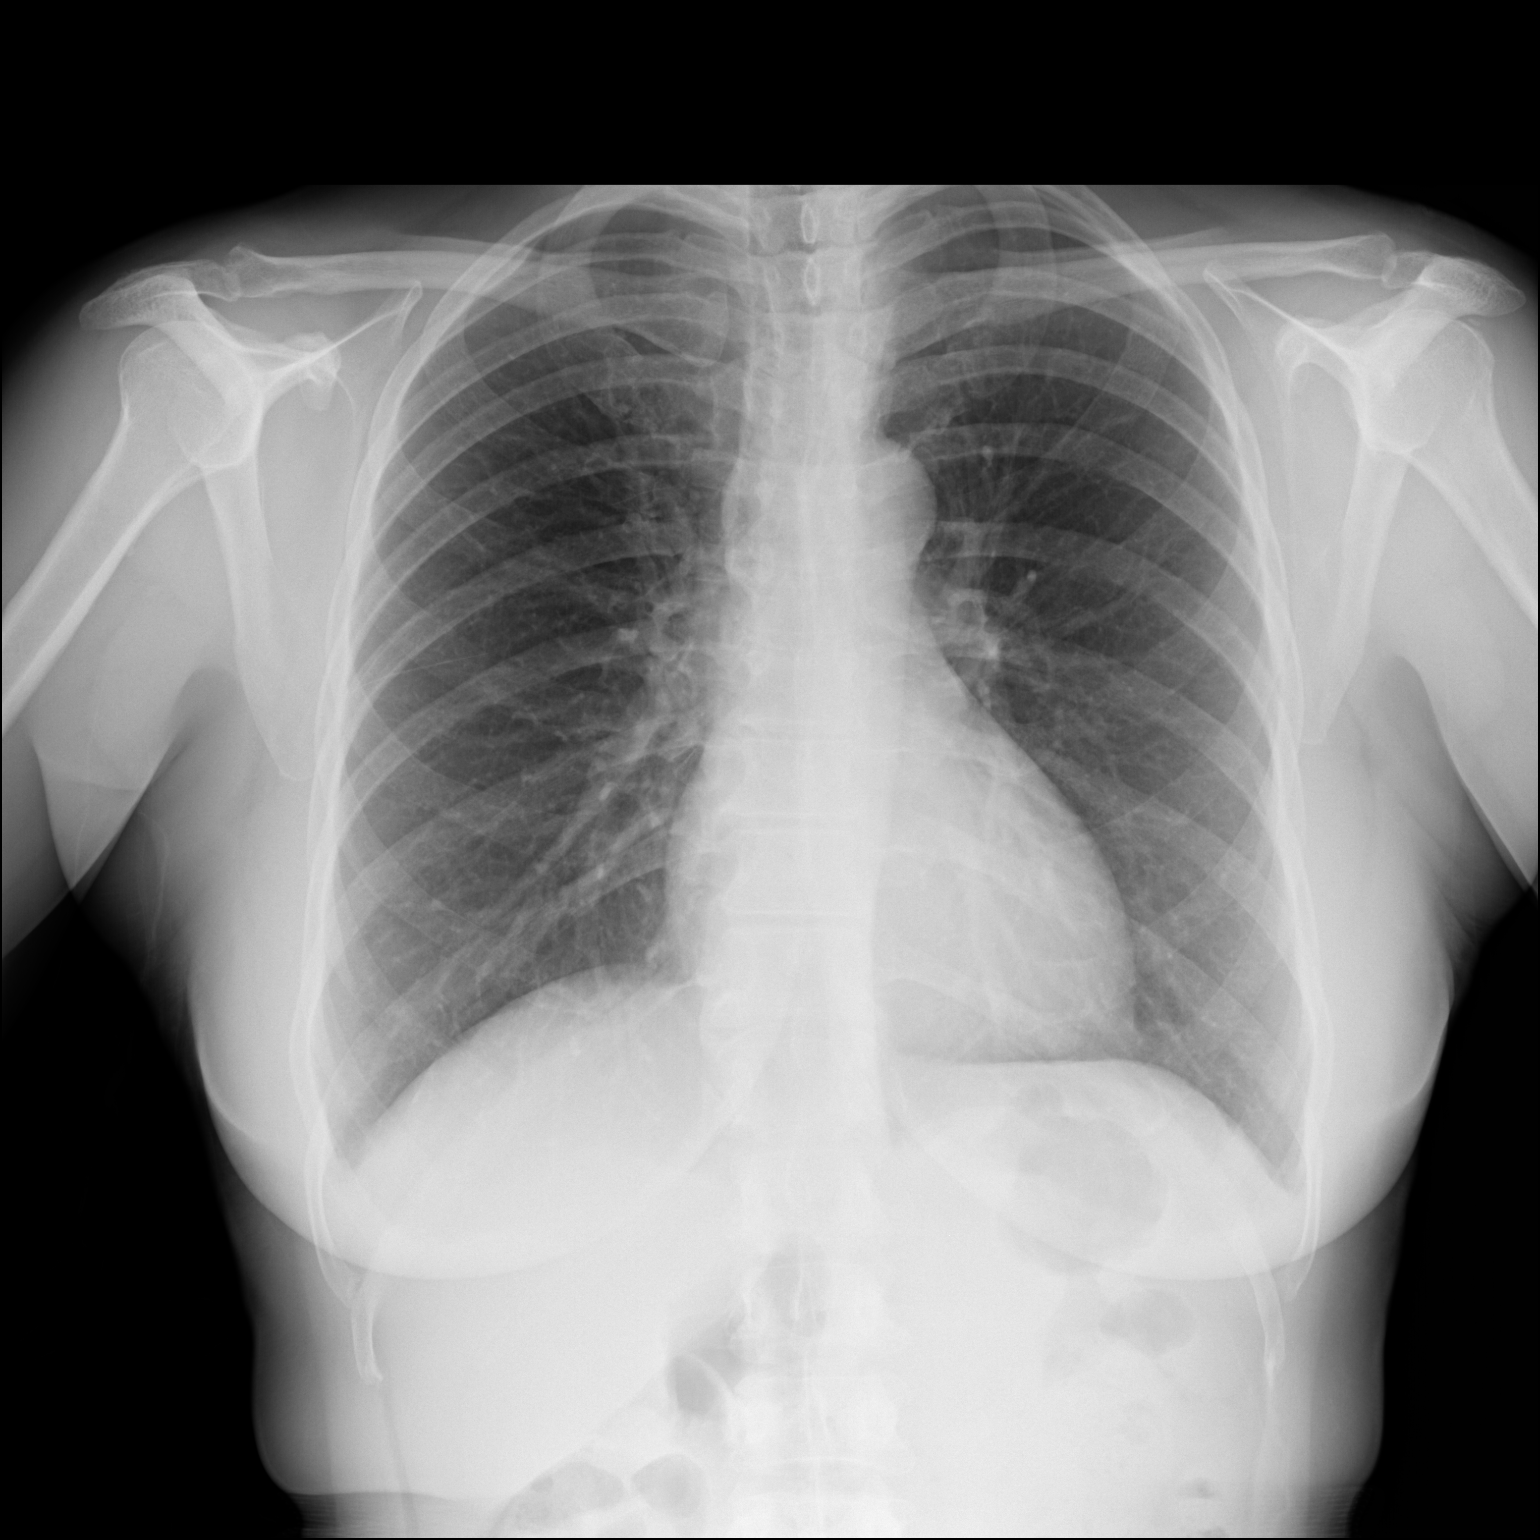

[dg chest 2 view (2 of 2)]
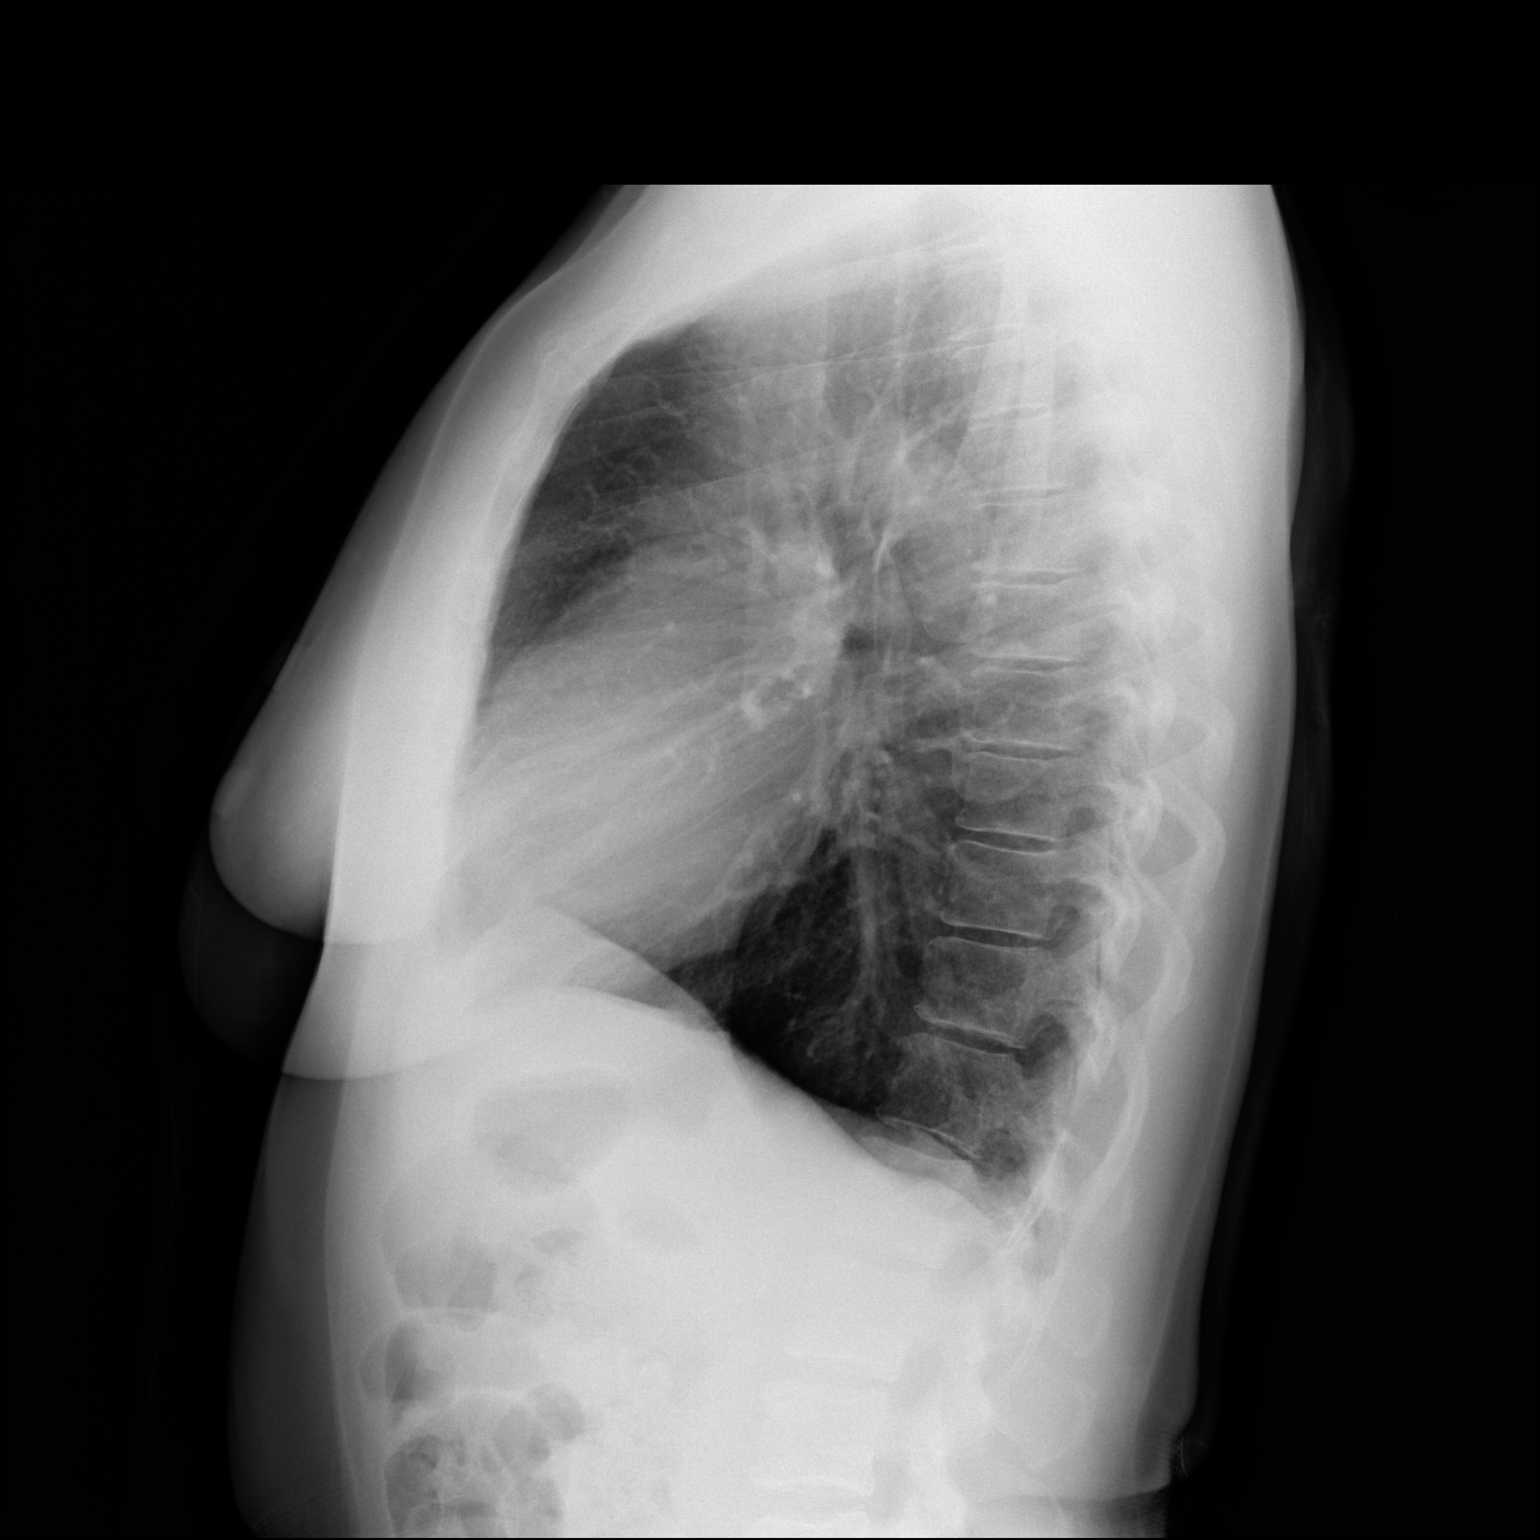

[2 of 2 positions shown; findings below may reference images not displayed]

FINDINGS: The heart size and mediastinal contours are within normal limits.
Both lungs are clear. The visualized skeletal structures are
unremarkable.
IMPRESSION: No active cardiopulmonary disease.

## 2023-08-08 DIAGNOSIS — F332 Major depressive disorder, recurrent severe without psychotic features: Secondary | ICD-10-CM | POA: Diagnosis not present

## 2023-08-09 DIAGNOSIS — F332 Major depressive disorder, recurrent severe without psychotic features: Secondary | ICD-10-CM | POA: Diagnosis not present

## 2023-08-12 DIAGNOSIS — F332 Major depressive disorder, recurrent severe without psychotic features: Secondary | ICD-10-CM | POA: Diagnosis not present

## 2023-08-13 DIAGNOSIS — F332 Major depressive disorder, recurrent severe without psychotic features: Secondary | ICD-10-CM | POA: Diagnosis not present

## 2023-08-15 ENCOUNTER — Encounter: Payer: Self-pay | Admitting: Nurse Practitioner

## 2023-08-15 ENCOUNTER — Other Ambulatory Visit
Admission: RE | Admit: 2023-08-15 | Discharge: 2023-08-15 | Disposition: A | Discharge status: Home / Self Care | Source: Ambulatory Visit | Attending: Nurse Practitioner | Admitting: Nurse Practitioner

## 2023-08-15 ENCOUNTER — Ambulatory Visit: Admitting: Nurse Practitioner

## 2023-08-15 VITALS — BP 90/60 | HR 81 | Temp 97.8°F | Ht 62.0 in | Wt 156.6 lb

## 2023-08-15 DIAGNOSIS — J4531 Mild persistent asthma with (acute) exacerbation: Secondary | ICD-10-CM | POA: Diagnosis not present

## 2023-08-15 DIAGNOSIS — J302 Other seasonal allergic rhinitis: Secondary | ICD-10-CM

## 2023-08-15 DIAGNOSIS — J309 Allergic rhinitis, unspecified: Secondary | ICD-10-CM

## 2023-08-15 DIAGNOSIS — J45991 Cough variant asthma: Secondary | ICD-10-CM

## 2023-08-15 DIAGNOSIS — R058 Other specified cough: Secondary | ICD-10-CM

## 2023-08-15 DIAGNOSIS — R053 Chronic cough: Secondary | ICD-10-CM

## 2023-08-15 LAB — CBC WITH DIFFERENTIAL/PLATELET
Abs Immature Granulocytes: 0 10*3/uL (ref 0.00–0.07)
Basophils Absolute: 0 10*3/uL (ref 0.0–0.1)
Basophils Relative: 1 %
Eosinophils Absolute: 0.1 10*3/uL (ref 0.0–0.5)
Eosinophils Relative: 2 %
HCT: 41 % (ref 36.0–46.0)
Hemoglobin: 13.7 g/dL (ref 12.0–15.0)
Immature Granulocytes: 0 %
Lymphocytes Relative: 30 %
Lymphs Abs: 1.5 10*3/uL (ref 0.7–4.0)
MCH: 30.4 pg (ref 26.0–34.0)
MCHC: 33.4 g/dL (ref 30.0–36.0)
MCV: 90.9 fL (ref 80.0–100.0)
Monocytes Absolute: 0.5 10*3/uL (ref 0.1–1.0)
Monocytes Relative: 10 %
Neutro Abs: 2.9 10*3/uL (ref 1.7–7.7)
Neutrophils Relative %: 57 %
Platelets: 238 10*3/uL (ref 150–400)
RBC: 4.51 MIL/uL (ref 3.87–5.11)
RDW: 13.6 % (ref 11.5–15.5)
WBC: 5 10*3/uL (ref 4.0–10.5)
nRBC: 0 % (ref 0.0–0.2)

## 2023-08-15 LAB — NITRIC OXIDE: Nitric Oxide: 23

## 2023-08-15 MED ORDER — PREDNISONE 20 MG PO TABS: 20.0000 mg | ORAL_TABLET | Freq: Every day | ORAL | 0 refills | Status: AC

## 2023-08-15 MED ORDER — BENZONATATE 200 MG PO CAPS: 200.0000 mg | ORAL_CAPSULE | Freq: Three times a day (TID) | ORAL | 1 refills | Status: DC | PRN

## 2023-08-15 MED ORDER — BUDESONIDE-FORMOTEROL FUMARATE 160-4.5 MCG/ACT IN AERO: 2.0000 | INHALATION_SPRAY | Freq: Two times a day (BID) | RESPIRATORY_TRACT | 11 refills | Status: DC

## 2023-08-15 MED ORDER — METHYLPREDNISOLONE ACETATE 80 MG/ML IJ SUSP
120.0000 mg | Freq: Once | INTRAMUSCULAR | Status: AC
  Administered 2023-08-15: 120 mg via INTRAMUSCULAR

## 2023-08-15 NOTE — Progress Notes (Signed)
 @Patient  ID: Jill Ashley, female    DOB: 05/17/1965, 58 y.o.   MRN: [id number]  Chief Complaint  Patient presents with   Follow-up    Cough, shortness of breath and occasional wheezing.     Referring provider: Karalee Oscar, PA  HPI: 58 year old female, former smoker followed for asthma. She is a patient of Dr. Alyne Jules and last seen in office 07/06/2022. Past medical history significant for chronic rhinitis, recurrent urticaria, insomnia, allergies, anxiety.   TEST/EVENTS:  07/21/2023 CXR: clear lungs   07/06/2022: OV with Dr. Gaynell Keeler. Cough following COVID about 14 months ago. On and off. Started on Symbicort  and only using as needed. Using nebulizer bid. Occasional chest tightness. Quit smoking in 2007 with only 2-3 cigarettes a day. Works in [employer] ge[employer]. Possible underlying asthma. Advised to continue Symbicort  on a regular basis for next ew weeks. Schedule PFT.  08/15/2023: Today - acute Discussed the use of AI scribe software for clinical note transcription with the patient, who gave verbal consent to proceed.  History of Present Illness   Jill Ashley is a 58 year old female with asthma who presents with a persistent cough. She is accompanied by her husband.  She has experienced a persistent cough for years. Did get better after starting Symbicort  previously. Worsening over the past couple of months. The cough became severe enough to require a visit to the emergency department about four weeks ago, where workup was unremarkable. She had a chest x ray that was clear. She was prescribed duonebs and was discharged. The cough is episodic and uncontrollable at times, with associated chest tightness and difficulty breathing. She otherwise doesn't have issues with her breathing.  She does have what sounds like a history of childhood asthma. She tells me she was treated for 'spastic bronchitis' during childhood. She's had multiple bronchitis episodes in adulthood, usually twice a year.    She is using Symbicort  Twice daily now. She also uses a portable nebulizer during severe episodes. No wheezing recently. She denies fever, chills, or hemoptysis. Occasional lightheadedness occurs, particularly after exertion, such as climbing stairs, which has been ongoing for a few years also. She has not had lung function testing.   The cough is worse in the mornings and is sometimes accompanied by postnasal drip. She takes Claritin in the morning, Zyrtec and montelukast at night, and has stopped using a nasal spray due to undergoing ketamine therapy via nasal spray. She denies reflux symptoms. The cough sometimes produces sputum, which was previously yellow-green but has since become less colored.  She has had COVID-19 two or three times, which she believes may have contributed to her ongoing respiratory issues.   FeNO 23 ppb       Allergies  Allergen Reactions   Penicillins Anaphylaxis and Hives    At age 72   Morphine And Codeine Other (See Comments)    Hyperventilation, heart racing, nausea    Immunization History  Administered Date(s) Administered   Influenza, Seasonal, Injecte, Preservative Fre 01/14/2023   Influenza,inj,Quad PF,6+ Mos 01/31/2022   Pfizer(Comirnaty )Fall Seasonal Vaccine 12 years and older 04/24/2022, 01/14/2023   Zoster Recombinant(Shingrix ) 01/16/2022    Past Medical History:  Diagnosis Date   Asthma 08/16/2023   High blood pressure    Urticaria     Tobacco History: Social History   Tobacco Use  Smoking Status Former   Current packs/day: 0.00   Average packs/day: 0.3 packs/day for 11.0 years (2.8 ttl pk-yrs)   Types: Cigarettes  Start date: 47   Quit date: 2006   Years since quitting: 19.3  Smokeless Tobacco Never   Counseling given: Not Answered   Outpatient Medications Prior to Visit  Medication Sig Dispense Refill   albuterol  (VENTOLIN  HFA) 108 (90 Base) MCG/ACT inhaler SMARTSIG:2 Puff(s) By Mouth Every 4 Hours PRN     Ascorbic  Acid (VITAMIN C PO) Take by mouth.     BIOTIN PO Take by mouth.     buPROPion (WELLBUTRIN SR) 200 MG 12 hr tablet Take 200 mg by mouth every morning.     Cholecalciferol (VITAMIN D3 PO) Take by mouth.     citalopram (CELEXA) 20 MG tablet Take 20 mg by mouth every morning.     COVID-19 At Home Antigen Test [facility] COVID-19 HOME TEST) KIT Use as directed 4 each 0   COVID-19 mRNA vaccine 2023-2024 (COMIRNATY ) syringe Inject 0.3 mLs into the muscle. 0.3 mL 0   estradiol  (VIVELLE -DOT) 0.1 MG/24HR patch 1 patch 2 (two) times a week.     ibuprofen  (ADVIL ) 800 MG tablet Take 1 tablet (800 mg total) by mouth 3 (three) times daily. 21 tablet 0   influenza vac split quadrivalent PF (FLUARIX QUADRIVALENT ) 0.5 ML injection Inject into the muscle. 0.5 mL 0   ipratropium-albuterol  (DUONEB) 0.5-2.5 (3) MG/3ML SOLN Take 3 mLs by nebulization every 6 (six) hours as needed. 180 mL 0   montelukast (SINGULAIR) 10 MG tablet Take 10 mg by mouth daily.     progesterone  (PROMETRIUM ) 100 MG capsule Take 1 capsule (100 mg total) by mouth daily as directed 30 capsule 3   traZODone  (DESYREL ) 50 MG tablet Take 1 tablet (50 mg total) by mouth at bedtime as needed. 90 tablet 3   Zoster Vaccine Adjuvanted (SHINGRIX ) injection Inject into the muscle. 0.5 mL 0   Zoster Vaccine Adjuvanted (SHINGRIX ) injection Inject into the muscle. 0.5 mL 0   budesonide -formoterol  (SYMBICORT ) 80-4.5 MCG/ACT inhaler SMARTSIG:2 Puff(s) By Mouth Twice Daily     methylPREDNISolone  (MEDROL  DOSEPAK) 4 MG TBPK tablet Use as directed 21 each 0   amLODipine  (NORVASC ) 5 MG tablet TAKE 1 TABLET BY MOUTH ONCE A DAY 90 tablet 2   Norethindrone-Ethinyl Estradiol -Fe Biphas (LO LOESTRIN FE ) 1 MG-10 MCG / 10 MCG tablet TAKE 1 TABLET BY MOUTH ONCE A DAY 28 tablet 12   No facility-administered medications prior to visit.     Review of Systems:   Constitutional: No weight loss or gain, night sweats, fevers, chills, fatigue, or lassitude. HEENT: No  headaches, difficulty swallowing, tooth/dental problems, or sore throat. No sneezing, itching, ear ache +nasal congestion, or post nasal drip CV:  No chest pain, orthopnea, PND, swelling in lower extremities, anasarca, dizziness, palpitations, syncope Resp: +shortness of breath with exertion; cough; wheeze. No excess mucus or change in color of mucus. No hemoptysis.  No chest wall deformity GI:  No heartburn, indigestion, abdominal pain, nausea, vomiting, diarrhea, change in bowel habits, loss of appetite, bloody stools.  GU: No dysuria, change in color of urine, urgency or frequency.  No flank pain, no hematuria  Skin: No rash, lesions, ulcerations MSK:  No joint pain or swelling.  Neuro: No dizziness or lightheadedness.  Psych: No depression or anxiety. Mood stable.     Physical Exam:  BP 90/60 (BP Location: Left Arm, Patient Position: Sitting, Cuff Size: Normal)   Pulse 81   Temp 97.8 F (36.6 C) (Temporal)   Ht 5\' 2"  (1.575 m)   Wt 156 lb 9.6 oz (71 kg)  SpO2 98%   BMI 28.64 kg/m   GEN: Pleasant, interactive, well-appearing; in no acute distress HEENT:  Normocephalic and atraumatic. PERRLA. Sclera white. Nasal turbinates pink, moist and patent bilaterally. No rhinorrhea present. Oropharynx pink and moist, without exudate or edema. No lesions, ulcerations, or postnasal drip.  NECK:  Supple w/ fair ROM. No JVD present. Normal carotid impulses w/o bruits. Thyroid symmetrical with no goiter or nodules palpated. No lymphadenopathy.   CV: RRR, no m/r/g, no peripheral edema. Pulses intact, +2 bilaterally. No cyanosis, pallor or clubbing. PULMONARY:  Unlabored, regular breathing. Minimal rhonchi bilaterally A&P. Bronchitic cough. No accessory muscle use.  GI: BS present and normoactive. Soft, non-tender to palpation. No organomegaly or masses detected.  MSK: No erythema, warmth or tenderness. Cap refil <2 sec all extrem. No deformities or joint swelling noted.  Neuro: A/Ox3. No focal  deficits noted.   Skin: Warm, no lesions or rashe Psych: Normal affect and behavior. Judgement and thought content appropriate.     Lab Results:  CBC    Component Value Date/Time   WBC 5.0 08/15/2023 1509   RBC 4.51 08/15/2023 1509   HGB 13.7 08/15/2023 1509   HGB 14.4 05/25/2019 1616   HCT 41.0 08/15/2023 1509   HCT 43.0 05/25/2019 1616   PLT 238 08/15/2023 1509   PLT 242 05/25/2019 1616   MCV 90.9 08/15/2023 1509   MCV 91 05/25/2019 1616   MCH 30.4 08/15/2023 1509   MCHC 33.4 08/15/2023 1509   RDW 13.6 08/15/2023 1509   RDW 12.9 05/25/2019 1616   LYMPHSABS 1.5 08/15/2023 1509   LYMPHSABS 1.5 05/25/2019 1616   MONOABS 0.5 08/15/2023 1509   EOSABS 0.1 08/15/2023 1509   EOSABS 0.1 05/25/2019 1616   BASOSABS 0.0 08/15/2023 1509   BASOSABS 0.0 05/25/2019 1616    BMET    Component Value Date/Time   NA 136 07/21/2023 2042   NA 140 05/25/2019 1616   K 3.7 07/21/2023 2042   CL 103 07/21/2023 2042   CO2 25 07/21/2023 2042   GLUCOSE 103 (H) 07/21/2023 2042   BUN 18 07/21/2023 2042   BUN 17 05/25/2019 1616   CREATININE 0.78 07/21/2023 2042   CALCIUM 9.4 07/21/2023 2042   GFRNONAA >60 07/21/2023 2042   GFRAA 90 05/25/2019 1616    BNP No results found for: "BNP"   Imaging:  DG Chest Port 1 View Result Date: 07/21/2023 CLINICAL DATA:  Shortness of breath and cough with chest burning. EXAM: PORTABLE CHEST 1 VIEW COMPARISON:  September 28, 2021 FINDINGS: The heart size and mediastinal contours are within normal limits. Both lungs are clear. Multilevel degenerative changes seen throughout the thoracic spine. IMPRESSION: No active disease. Electronically Signed   By: Virgle Grime M.D.   On: 07/21/2023 20:14    methylPREDNISolone  acetate (DEPO-MEDROL ) injection 120 mg     Date Action Dose Route User   08/15/2023 1451 Given 120 mg Intramuscular (Right Ventrogluteal) Jones Ness, CMA           No data to display          Lab Results  Component Value  Date   NITRICOXIDE 23 08/15/2023        Assessment & Plan:   Cough variant asthma Constellation of symptoms consistent with asthma. Borderline elevated exhaled nitric oxide  testing, consistent with type II inflammatory process. Suspect component related to upper airway irritation as well. Will increase ICS dosing to Symbicort  160 mcg. Treat with depo 80 mg inj x 1 and prednisone   burst. Initiate cough control measures. Action plan in place. Check allergen panel and peripheral eos today. Recent CXR clear. Scheduled PFT.  Patient Instructions  Increased Symbicort  dosing 2 puffs Twice daily. Brush tongue and rinse mouth afterwards Continue Albuterol  inhaler 2 puffs or 3 mL neb every 6 hours as needed for shortness of breath or wheezing. Notify if symptoms persist despite rescue inhaler/neb use.  Continue claritin daily Continue singulair daily   Suspect a component of your cough is related to upper airway irritation as well as your asthma   -Prednisone  20 mg for 5 days. Take in AM with food. Start tomorrow. If you cannot tolerate this, let me know and we can try doing a slightly lower dose -Delsym 2 tsp Twice daily for cough over the counter.  -Benzonatate  1 capsule Three times a day for cough  Upper airway cough syndrome: Suppress your cough to allow your larynx (voice box) to heal.  Limit talking for the next few days. Avoid throat clearing. Work on cough suppression with the above recommended suppressants.  Use sugar free hard candies or non-menthol cough drops during this time to soothe your throat.  Warm tea with honey and lemon.   Steroid shot today  Recent chest x ray was clear  Pulmonary function testing upon return   Labs today  Follow up in 4-6 weeks with Dr. Gaynell Keeler or Gina Lagos after PFT. If symptoms do not improve or worsen, please contact office for sooner follow up or seek emergency care.    Upper airway cough syndrome See above. Target postnasal drainage and  cough control.   Allergic rhinitis Continue allergy regimen. check allergen panel .   Advised if symptoms do not improve or worsen, to please contact office for sooner follow up or seek emergency care.   I spent 35 minutes of dedicated to the care of this patient on the date of this encounter to include pre-visit review of records, face-to-face time with the patient discussing conditions above, post visit ordering of testing, clinical documentation with the electronic health record, making appropriate referrals as documented, and communicating necessary findings to members of the patients care team.  Roetta Clarke, NP 08/16/2023  Pt aware and understands NP's role.

## 2023-08-15 NOTE — Patient Instructions (Addendum)
 Increased Symbicort  dosing 2 puffs Twice daily. Brush tongue and rinse mouth afterwards Continue Albuterol  inhaler 2 puffs or 3 mL neb every 6 hours as needed for shortness of breath or wheezing. Notify if symptoms persist despite rescue inhaler/neb use.  Continue claritin daily Continue singulair daily   Suspect a component of your cough is related to upper airway irritation as well as your asthma   -Prednisone  20 mg for 5 days. Take in AM with food. Start tomorrow. If you cannot tolerate this, let me know and we can try doing a slightly lower dose -Delsym 2 tsp Twice daily for cough over the counter.  -Benzonatate  1 capsule Three times a day for cough  Upper airway cough syndrome: Suppress your cough to allow your larynx (voice box) to heal.  Limit talking for the next few days. Avoid throat clearing. Work on cough suppression with the above recommended suppressants.  Use sugar free hard candies or non-menthol cough drops during this time to soothe your throat.  Warm tea with honey and lemon.   Steroid shot today  Recent chest x ray was clear  Pulmonary function testing upon return   Labs today  Follow up in 4-6 weeks with Dr. Gaynell Keeler or Gina Lagos after PFT. If symptoms do not improve or worsen, please contact office for sooner follow up or seek emergency care.

## 2023-08-16 ENCOUNTER — Encounter: Payer: Self-pay | Admitting: Nurse Practitioner

## 2023-08-16 DIAGNOSIS — J45991 Cough variant asthma: Secondary | ICD-10-CM | POA: Insufficient documentation

## 2023-08-16 DIAGNOSIS — R058 Other specified cough: Secondary | ICD-10-CM

## 2023-08-16 DIAGNOSIS — J309 Allergic rhinitis, unspecified: Secondary | ICD-10-CM | POA: Insufficient documentation

## 2023-08-16 DIAGNOSIS — J45909 Unspecified asthma, uncomplicated: Secondary | ICD-10-CM

## 2023-08-16 DIAGNOSIS — F332 Major depressive disorder, recurrent severe without psychotic features: Secondary | ICD-10-CM | POA: Diagnosis not present

## 2023-08-16 HISTORY — DX: Unspecified asthma, uncomplicated: J45.909

## 2023-08-16 HISTORY — DX: Other specified cough: R05.8

## 2023-08-16 NOTE — Assessment & Plan Note (Addendum)
 See above. Target postnasal drainage and cough control.

## 2023-08-16 NOTE — Assessment & Plan Note (Signed)
 Constellation of symptoms consistent with asthma. Borderline elevated exhaled nitric oxide  testing, consistent with type II inflammatory process. Suspect component related to upper airway irritation as well. Will increase ICS dosing to Symbicort  160 mcg. Treat with depo 80 mg inj x 1 and prednisone  burst. Initiate cough control measures. Action plan in place. Check allergen panel and peripheral eos today. Recent CXR clear. Scheduled PFT.  Patient Instructions  Increased Symbicort  dosing 2 puffs Twice daily. Brush tongue and rinse mouth afterwards Continue Albuterol  inhaler 2 puffs or 3 mL neb every 6 hours as needed for shortness of breath or wheezing. Notify if symptoms persist despite rescue inhaler/neb use.  Continue claritin daily Continue singulair daily   Suspect a component of your cough is related to upper airway irritation as well as your asthma   -Prednisone  20 mg for 5 days. Take in AM with food. Start tomorrow. If you cannot tolerate this, let me know and we can try doing a slightly lower dose -Delsym 2 tsp Twice daily for cough over the counter.  -Benzonatate  1 capsule Three times a day for cough  Upper airway cough syndrome: Suppress your cough to allow your larynx (voice box) to heal.  Limit talking for the next few days. Avoid throat clearing. Work on cough suppression with the above recommended suppressants.  Use sugar free hard candies or non-menthol cough drops during this time to soothe your throat.  Warm tea with honey and lemon.   Steroid shot today  Recent chest x ray was clear  Pulmonary function testing upon return   Labs today  Follow up in 4-6 weeks with Dr. Gaynell Keeler or Gina Lagos after PFT. If symptoms do not improve or worsen, please contact office for sooner follow up or seek emergency care.

## 2023-08-16 NOTE — Assessment & Plan Note (Signed)
 Continue allergy regimen. check allergen panel .

## 2023-08-18 LAB — ALLERGEN PANEL (27) + IGE
Alternaria Alternata IgE: <0.1 kU/L
Aspergillus Fumigatus IgE: <0.1 kU/L
Bahia Grass IgE: <0.1 kU/L
Bermuda Grass IgE: <0.1 kU/L
Cat Dander IgE: <0.1 kU/L
Cedar, Mountain IgE: <0.1 kU/L
Cladosporium Herbarum IgE: <0.1 kU/L
Cocklebur IgE: <0.1 kU/L
Cockroach, American IgE: <0.1 kU/L
Common Silver Birch IgE: <0.1 kU/L
D Farinae IgE: <0.1 kU/L
D Pteronyssinus IgE: <0.1 kU/L
Dog Dander IgE: <0.1 kU/L
Elm, American IgE: <0.1 kU/L
Hickory, White IgE: <0.1 kU/L
IgE (Immunoglobulin E), Serum: 24 [IU]/mL (ref 6–495)
Johnson Grass IgE: <0.1 kU/L
Kentucky Bluegrass IgE: <0.1 kU/L
Maple/Box Elder IgE: <0.1 kU/L
Mucor Racemosus IgE: <0.1 kU/L
Oak, White IgE: <0.1 kU/L
Penicillium Chrysogen IgE: <0.1 kU/L
Pigweed, Rough IgE: <0.1 kU/L
Plantain, English IgE: <0.1 kU/L
Ragweed, Short IgE: <0.1 kU/L
Setomelanomma Rostrat: <0.1 kU/L
Timothy Grass IgE: <0.1 kU/L
White Mulberry IgE: <0.1 kU/L

## 2023-08-20 DIAGNOSIS — F332 Major depressive disorder, recurrent severe without psychotic features: Secondary | ICD-10-CM | POA: Diagnosis not present

## 2023-08-22 DIAGNOSIS — F332 Major depressive disorder, recurrent severe without psychotic features: Secondary | ICD-10-CM | POA: Diagnosis not present

## 2023-08-27 DIAGNOSIS — F332 Major depressive disorder, recurrent severe without psychotic features: Secondary | ICD-10-CM | POA: Diagnosis not present

## 2023-09-03 DIAGNOSIS — F332 Major depressive disorder, recurrent severe without psychotic features: Secondary | ICD-10-CM | POA: Diagnosis not present

## 2023-09-05 DIAGNOSIS — F332 Major depressive disorder, recurrent severe without psychotic features: Secondary | ICD-10-CM | POA: Diagnosis not present

## 2023-09-12 DIAGNOSIS — F332 Major depressive disorder, recurrent severe without psychotic features: Secondary | ICD-10-CM | POA: Diagnosis not present

## 2023-09-13 DIAGNOSIS — F332 Major depressive disorder, recurrent severe without psychotic features: Secondary | ICD-10-CM | POA: Diagnosis not present

## 2023-09-17 DIAGNOSIS — F332 Major depressive disorder, recurrent severe without psychotic features: Secondary | ICD-10-CM | POA: Diagnosis not present

## 2023-09-20 DIAGNOSIS — F332 Major depressive disorder, recurrent severe without psychotic features: Secondary | ICD-10-CM | POA: Diagnosis not present

## 2023-09-24 DIAGNOSIS — F332 Major depressive disorder, recurrent severe without psychotic features: Secondary | ICD-10-CM | POA: Diagnosis not present

## 2023-09-27 DIAGNOSIS — F332 Major depressive disorder, recurrent severe without psychotic features: Secondary | ICD-10-CM | POA: Diagnosis not present

## 2023-10-01 DIAGNOSIS — F332 Major depressive disorder, recurrent severe without psychotic features: Secondary | ICD-10-CM | POA: Diagnosis not present

## 2023-10-04 DIAGNOSIS — F332 Major depressive disorder, recurrent severe without psychotic features: Secondary | ICD-10-CM | POA: Diagnosis not present

## 2023-10-08 DIAGNOSIS — F332 Major depressive disorder, recurrent severe without psychotic features: Secondary | ICD-10-CM | POA: Diagnosis not present

## 2023-10-11 DIAGNOSIS — F332 Major depressive disorder, recurrent severe without psychotic features: Secondary | ICD-10-CM | POA: Diagnosis not present

## 2023-10-17 DIAGNOSIS — F332 Major depressive disorder, recurrent severe without psychotic features: Secondary | ICD-10-CM | POA: Diagnosis not present

## 2023-10-21 DIAGNOSIS — F332 Major depressive disorder, recurrent severe without psychotic features: Secondary | ICD-10-CM | POA: Diagnosis not present

## 2023-10-22 DIAGNOSIS — F332 Major depressive disorder, recurrent severe without psychotic features: Secondary | ICD-10-CM | POA: Diagnosis not present

## 2023-10-25 DIAGNOSIS — F332 Major depressive disorder, recurrent severe without psychotic features: Secondary | ICD-10-CM | POA: Diagnosis not present

## 2023-10-31 DIAGNOSIS — F332 Major depressive disorder, recurrent severe without psychotic features: Secondary | ICD-10-CM | POA: Diagnosis not present

## 2023-11-01 DIAGNOSIS — F332 Major depressive disorder, recurrent severe without psychotic features: Secondary | ICD-10-CM | POA: Diagnosis not present

## 2023-11-04 DIAGNOSIS — F332 Major depressive disorder, recurrent severe without psychotic features: Secondary | ICD-10-CM | POA: Diagnosis not present

## 2023-11-08 DIAGNOSIS — F332 Major depressive disorder, recurrent severe without psychotic features: Secondary | ICD-10-CM | POA: Diagnosis not present

## 2023-11-11 DIAGNOSIS — F332 Major depressive disorder, recurrent severe without psychotic features: Secondary | ICD-10-CM | POA: Diagnosis not present

## 2023-11-14 DIAGNOSIS — F332 Major depressive disorder, recurrent severe without psychotic features: Secondary | ICD-10-CM | POA: Diagnosis not present

## 2023-11-21 ENCOUNTER — Telehealth: Payer: Self-pay | Admitting: Nurse Practitioner

## 2023-11-21 DIAGNOSIS — F332 Major depressive disorder, recurrent severe without psychotic features: Secondary | ICD-10-CM | POA: Diagnosis not present

## 2023-11-21 NOTE — Telephone Encounter (Signed)
 LVMTCB to schedule PFT and rov.

## 2023-11-26 DIAGNOSIS — Z01419 Encounter for gynecological examination (general) (routine) without abnormal findings: Secondary | ICD-10-CM | POA: Diagnosis not present

## 2023-11-26 DIAGNOSIS — Z124 Encounter for screening for malignant neoplasm of cervix: Secondary | ICD-10-CM | POA: Diagnosis not present

## 2023-11-26 DIAGNOSIS — Z1151 Encounter for screening for human papillomavirus (HPV): Secondary | ICD-10-CM | POA: Diagnosis not present

## 2023-11-26 DIAGNOSIS — Z113 Encounter for screening for infections with a predominantly sexual mode of transmission: Secondary | ICD-10-CM | POA: Diagnosis not present

## 2023-11-26 DIAGNOSIS — N898 Other specified noninflammatory disorders of vagina: Secondary | ICD-10-CM | POA: Diagnosis not present

## 2023-11-29 DIAGNOSIS — F332 Major depressive disorder, recurrent severe without psychotic features: Secondary | ICD-10-CM | POA: Diagnosis not present

## 2023-12-02 DIAGNOSIS — F332 Major depressive disorder, recurrent severe without psychotic features: Secondary | ICD-10-CM | POA: Diagnosis not present

## 2023-12-04 DIAGNOSIS — F332 Major depressive disorder, recurrent severe without psychotic features: Secondary | ICD-10-CM | POA: Diagnosis not present

## 2023-12-05 DIAGNOSIS — F332 Major depressive disorder, recurrent severe without psychotic features: Secondary | ICD-10-CM | POA: Diagnosis not present

## 2023-12-10 DIAGNOSIS — F332 Major depressive disorder, recurrent severe without psychotic features: Secondary | ICD-10-CM | POA: Diagnosis not present

## 2023-12-11 DIAGNOSIS — F332 Major depressive disorder, recurrent severe without psychotic features: Secondary | ICD-10-CM | POA: Diagnosis not present

## 2023-12-24 DIAGNOSIS — F332 Major depressive disorder, recurrent severe without psychotic features: Secondary | ICD-10-CM | POA: Diagnosis not present

## 2023-12-25 DIAGNOSIS — F332 Major depressive disorder, recurrent severe without psychotic features: Secondary | ICD-10-CM | POA: Diagnosis not present

## 2023-12-31 DIAGNOSIS — F332 Major depressive disorder, recurrent severe without psychotic features: Secondary | ICD-10-CM | POA: Diagnosis not present

## 2024-01-01 DIAGNOSIS — F4312 Post-traumatic stress disorder, chronic: Secondary | ICD-10-CM | POA: Diagnosis not present

## 2024-01-01 DIAGNOSIS — F332 Major depressive disorder, recurrent severe without psychotic features: Secondary | ICD-10-CM | POA: Diagnosis not present

## 2024-01-02 ENCOUNTER — Ambulatory Visit: Admitting: Internal Medicine

## 2024-01-02 VITALS — BP 126/80 | HR 78 | Temp 98.0°F | Ht 62.0 in | Wt 154.4 lb

## 2024-01-02 DIAGNOSIS — I10 Essential (primary) hypertension: Secondary | ICD-10-CM | POA: Diagnosis not present

## 2024-01-02 DIAGNOSIS — E786 Lipoprotein deficiency: Secondary | ICD-10-CM | POA: Diagnosis not present

## 2024-01-02 DIAGNOSIS — J309 Allergic rhinitis, unspecified: Secondary | ICD-10-CM

## 2024-01-02 DIAGNOSIS — Z23 Encounter for immunization: Secondary | ICD-10-CM | POA: Diagnosis not present

## 2024-01-02 DIAGNOSIS — E559 Vitamin D deficiency, unspecified: Secondary | ICD-10-CM

## 2024-01-02 DIAGNOSIS — J4531 Mild persistent asthma with (acute) exacerbation: Secondary | ICD-10-CM

## 2024-01-02 DIAGNOSIS — Z7189 Other specified counseling: Secondary | ICD-10-CM

## 2024-01-02 DIAGNOSIS — F4312 Post-traumatic stress disorder, chronic: Secondary | ICD-10-CM

## 2024-01-02 DIAGNOSIS — E781 Pure hyperglyceridemia: Secondary | ICD-10-CM | POA: Diagnosis not present

## 2024-01-02 DIAGNOSIS — Z780 Asymptomatic menopausal state: Secondary | ICD-10-CM

## 2024-01-02 LAB — LIPID PANEL
Cholesterol: 232 mg/dL — ABNORMAL HIGH (ref 0–200)
HDL: 55 mg/dL (ref >39.00)
LDL Cholesterol: 144 mg/dL — ABNORMAL HIGH (ref 0–99)
NonHDL: 177.07
Total CHOL/HDL Ratio: 4
Triglycerides: 165 mg/dL — ABNORMAL HIGH (ref 0.0–149.0)
VLDL: 33 mg/dL (ref 0.0–40.0)

## 2024-01-02 LAB — COMPREHENSIVE METABOLIC PANEL WITH GFR
ALT: 10 U/L (ref 0–35)
AST: 14 U/L (ref 0–37)
Albumin: 4.1 g/dL (ref 3.5–5.2)
Alkaline Phosphatase: 34 U/L — ABNORMAL LOW (ref 39–117)
BUN: 14 mg/dL (ref 6–23)
CO2: 29 meq/L (ref 19–32)
Calcium: 9.3 mg/dL (ref 8.4–10.5)
Chloride: 104 meq/L (ref 96–112)
Creatinine, Ser: 0.88 mg/dL (ref 0.40–1.20)
GFR: 72.58 mL/min (ref >60.00)
Glucose, Bld: 83 mg/dL (ref 70–99)
Potassium: 4.4 meq/L (ref 3.5–5.1)
Sodium: 138 meq/L (ref 135–145)
Total Bilirubin: 0.4 mg/dL (ref 0.2–1.2)
Total Protein: 6.8 g/dL (ref 6.0–8.3)

## 2024-01-02 LAB — CBC WITH DIFFERENTIAL/PLATELET
Basophils Absolute: 0 K/uL (ref 0.0–0.1)
Basophils Relative: 0.7 % (ref 0.0–3.0)
Eosinophils Absolute: 0.1 K/uL (ref 0.0–0.7)
Eosinophils Relative: 3.2 % (ref 0.0–5.0)
HCT: 42 % (ref 36.0–46.0)
Hemoglobin: 14 g/dL (ref 12.0–15.0)
Lymphocytes Relative: 35.2 % (ref 12.0–46.0)
Lymphs Abs: 1.4 K/uL (ref 0.7–4.0)
MCHC: 33.3 g/dL (ref 30.0–36.0)
MCV: 90.8 fl (ref 78.0–100.0)
Monocytes Absolute: 0.4 K/uL (ref 0.1–1.0)
Monocytes Relative: 9.9 % (ref 3.0–12.0)
Neutro Abs: 2 K/uL (ref 1.4–7.7)
Neutrophils Relative %: 51 % (ref 43.0–77.0)
Platelets: 224 K/uL (ref 150.0–400.0)
RBC: 4.62 Mil/uL (ref 3.87–5.11)
RDW: 13 % (ref 11.5–15.5)
WBC: 3.9 K/uL — ABNORMAL LOW (ref 4.0–10.5)

## 2024-01-02 LAB — VITAMIN D 25 HYDROXY (VIT D DEFICIENCY, FRACTURES): VITD: 66.88 ng/mL (ref 30.00–100.00)

## 2024-01-02 MED ORDER — IPRATROPIUM-ALBUTEROL 0.5-2.5 (3) MG/3ML IN SOLN: 3.0000 mL | Freq: Four times a day (QID) | RESPIRATORY_TRACT | 0 refills | Status: AC | PRN

## 2024-01-02 MED ORDER — CENTRUM FRESH/FRUITY ADULT PO CHEW: 1.0000 | CHEWABLE_TABLET | Freq: Every day | ORAL | 4 refills | Status: AC

## 2024-01-02 MED ORDER — ALBUTEROL SULFATE HFA 108 (90 BASE) MCG/ACT IN AERS: 2.0000 | INHALATION_SPRAY | RESPIRATORY_TRACT | 4 refills | Status: AC | PRN

## 2024-01-02 MED ORDER — MONTELUKAST SODIUM 10 MG PO TABS: 10.0000 mg | ORAL_TABLET | Freq: Every day | ORAL | 4 refills | Status: AC

## 2024-01-02 MED ORDER — AMLODIPINE BESYLATE 5 MG PO TABS: 5.0000 mg | ORAL_TABLET | Freq: Every day | ORAL | 2 refills | Status: AC

## 2024-01-02 MED ORDER — VITAMIN D3 25 MCG (1000 UNIT) PO TABS: 1000.0000 [IU] | ORAL_TABLET | Freq: Every day | ORAL | 4 refills | Status: AC

## 2024-01-02 MED ORDER — BUDESONIDE-FORMOTEROL FUMARATE 160-4.5 MCG/ACT IN AERO
2.0000 | INHALATION_SPRAY | Freq: Two times a day (BID) | RESPIRATORY_TRACT | 4 refills | Status: AC
Start: 2024-01-02 — End: ?

## 2024-01-02 NOTE — Patient Instructions (Addendum)
 It was a pleasure seeing you today! Your health and satisfaction are our top priorities.  Bernardino Cone, MD  VISIT SUMMARY: Today, you came in to establish care and for medication refills. We discussed your ongoing treatments and reviewed your current medications. You received a flu shot and we talked about your overall health and future care plans.  YOUR PLAN: -COMPLEX POST-TRAUMATIC STRESS DISORDER (CPTSD) WITH INSOMNIA: CPTSD is a condition that can develop after prolonged exposure to traumatic events and can cause severe stress and sleep issues. You are currently on Spravato twice weekly, which you find effective. We discussed the possibility of adding EMDR therapy and CBT-I for better management of your symptoms. Continue with your current treatment and consider discussing these additional therapies with your therapist.  -ASTHMA: Asthma is a condition that affects your airways and can cause breathing difficulties. Your asthma is well-controlled with Symbicort  and Ventolin . We refilled these medications and you received a flu shot to help prevent respiratory infections.  -HYPERTENSION: Hypertension, or high blood pressure, is a condition that can lead to serious health issues if not managed properly. Your blood pressure is well-controlled with your current medication, amlodipine , which we have refilled.  -HYPERLIPIDEMIA: Hyperlipidemia is a condition where you have high levels of cholesterol in your blood, which can increase the risk of heart disease. We need to check your current cholesterol levels, so a fasting lipid panel has been ordered.  -VITAMIN D  DEFICIENCY (TREATED): Vitamin D  deficiency can lead to bone problems and other health issues. You are currently taking vitamin D  supplements, which we have refilled.  -MENOPAUSE WITH ESTROGEN REPLACEMENT THERAPY: Menopause is a natural decline in reproductive hormones when a woman reaches her 47s or 52s. You are on estrogen replacement therapy,  which helps with symptoms but has some long-term risks. We discussed the benefits and risks, and I recommend considering discontinuation within one to two years. Please discuss this with your gynecologist.  INSTRUCTIONS: Please follow up with your therapist to discuss the potential addition of EMDR therapy and CBT-I for your CPTSD and insomnia. Also, schedule a fasting lipid panel to check your cholesterol levels. Discuss the discontinuation of estrogen therapy with your gynecologist within the next one to two years.  Your Providers PCP: Cone Bernardino MATSU, MD,  [phone]) Referring Provider: Alys Schuyler HERO, [place],  [phone]) Care Team Provider: Patient, No Pcp Per  NEXT STEPS: [x]  Early Intervention: Schedule sooner appointment, call our on-call services, or go to emergency room if there is any significant Increase in pain or discomfort New or worsening symptoms Sudden or severe changes in your health [x]  Flexible Follow-Up: We recommend a No follow-ups on file. for optimal routine care. This allows for progress monitoring and treatment adjustments. [x]  Preventive Care: Schedule your annual preventive care visit! It's typically covered by insurance and helps identify potential health issues early. [x]  Lab & X-ray Appointments: Incomplete tests scheduled today, or call to schedule. X-rays: [facility] (M-F, 8:30am-noon or 1pm-5pm). [x]  Medical Information Release: Sign a release form at front desk to obtain relevant medical information we don't have.  MAKING THE MOST OF OUR FOCUSED 20 MINUTE APPOINTMENTS: [x]   Clearly state your top concerns at the beginning of the visit to focus our discussion [x]   If you anticipate you will need more time, please inform the front desk during scheduling - we can book multiple appointments in the same week. [x]   If you have transportation problems- use our convenient video appointments or ask about  transportation support. [x]   We can  get down to business faster if you use MyChart to update information before the visit and submit non-urgent questions before your visit. Thank you for taking the time to provide details through MyChart.  Let our nurse know and she can import this information into your encounter documents.  Arrival and Wait Times: [x]   Arriving on time ensures that everyone receives prompt attention. [x]   Early morning (8a) and afternoon (1p) appointments tend to have shortest wait times. [x]   Unfortunately, we cannot delay appointments for late arrivals or hold slots during phone calls.  Getting Answers and Following Up [x]   Simple Questions & Concerns: For quick questions or basic follow-up after your visit, reach us  at [phone] or MyChart messaging. [x]   Complex Concerns: If your concern is more complex, scheduling an appointment might be best. Discuss this with the staff to find the most suitable option. [x]   Lab & Imaging Results: We'll contact you directly if results are abnormal or you don't use MyChart. Most normal results will be on MyChart within 2-3 business days, with a review message from Dr. Jesus. Haven't heard back in 2 weeks? Need results sooner? Contact us  at [phone]. [x]   Referrals: Our referral coordinator will manage specialist referrals. The specialist's office should contact you within 2 weeks to schedule an appointment. Call us  if you haven't heard from them after 2 weeks.  Staying Connected [x]   MyChart: Activate your MyChart for the fastest way to access results and message us . See the last page of this paperwork for instructions on how to activate.  Bring to Your Next Appointment [x]   Medications: Please bring all your medication bottles to your next appointment to ensure we have an accurate record of your prescriptions. [x]   Health Diaries: If you're monitoring any health conditions at home, keeping a diary of your readings can be very helpful for discussions at your next  appointment.  Billing [x]   X-ray & Lab Orders: These are billed by separate companies. Contact the invoicing company directly for questions or concerns. [x]   Visit Charges: Discuss any billing inquiries with our administrative services team.  Your Satisfaction Matters [x]   Share Your Experience: We strive for your satisfaction! If you have any complaints, or preferably compliments, please let Dr. Jesus know directly or contact our Practice Administrators, Manuelita Rubin or [employer], by asking at the front desk.   Reviewing Your Records [x]   Review this early draft of your clinical encounter notes below and the final encounter summary tomorrow on MyChart after its been completed.  All orders placed so far are visible here: Hypertension, unspecified type -     amLODIPine  Besylate; TAKE 1 TABLET BY MOUTH ONCE A DAY  Dispense: 90 tablet; Refill: 2 -     Centrum Fresh/Fruity Adult; Chew 1 tablet by mouth daily.  Dispense: 90 tablet; Refill: 4 -     Comprehensive metabolic panel with GFR -     CBC with Differential/Platelet  Mild persistent asthma with acute exacerbation -     Ipratropium-Albuterol ; Take 3 mLs by nebulization every 6 (six) hours as needed.  Dispense: 90 mL; Refill: 0 -     Montelukast  Sodium; Take 1 tablet (10 mg total) by mouth daily.  Dispense: 90 tablet; Refill: 4 -     Budesonide -Formoterol  Fumarate; Inhale 2 puffs into the lungs in the morning and at bedtime.  Dispense: 10.2 g; Refill: 4 -     Centrum Fresh/Fruity Adult; Chew 1  tablet by mouth daily.  Dispense: 90 tablet; Refill: 4  Allergic rhinitis, unspecified seasonality, unspecified trigger -     Albuterol  Sulfate HFA; Inhale 2 puffs into the lungs every 4 (four) hours as needed for wheezing or shortness of breath.  Dispense: 8.5 g; Refill: 4 -     Centrum Fresh/Fruity Adult; Chew 1 tablet by mouth daily.  Dispense: 90 tablet; Refill: 4  Vitamin D  deficiency -     Vitamin D3; Take 1 tablet (1,000 Units total)  by mouth daily at 6 (six) AM.  Dispense: 90 tablet; Refill: 4 -     Centrum Fresh/Fruity Adult; Chew 1 tablet by mouth daily.  Dispense: 90 tablet; Refill: 4 -     VITAMIN D  25 Hydroxy (Vit-D Deficiency, Fractures)  Postmenopausal estrogen deficiency -     Centrum Fresh/Fruity Adult; Chew 1 tablet by mouth daily.  Dispense: 90 tablet; Refill: 4  Need for immunization against influenza  Abnormally low high density lipoprotein (HDL) cholesterol with hypertriglyceridemia -     Lipid panel  Chronic post-traumatic stress disorder (PTSD) -     TSH Rfx on Abnormal to Free T4  Immunization due -     Flu vaccine trivalent PF, 6mos and older(Flulaval ,Afluria,Fluarix ,Fluzone)  Counseling for estrogen replacement therapy         Talk to your therapist about CBT-I for insomnia, AND, adding buzzers for Eye Movement Desensitization and Reprocessing (EMDR) type enhancement of trauma informed therapy.   Eye Movement Desensitization and Reprocessing (EMDR)  [facility] Address: [address] [place], [place], [address] Phone: [phone] Services: Individual Counseling, Family Therapy, Couples Counseling, Trauma Therapy, Stress Management, Depression Treatment, Anxiety Treatment, and Grief Counseling   Orie Peers, [facility] Address: [address] [place], [place], [place], [address] Phone: [phone] Specializes in EMDR, PTSD, anxiety, cognitive behavioral therapy, and other disorders   [facility] Address: [address] [place], [place], [place], [address] Phone: [phone] Offers professional counseling specializing in Complex Trauma and Dissociative Disorders, mood disorders, personality disorders, and EMDR 3.  [facility] Address: [address] [place], [place], [address] Phone: [phone] Specializes in trauma, addiction, and perinatal therapy services 4.  [facility] [address] [place], [place] [address] [phone]  Alm Jim Handler counselor doing Eye Movement Desensitization and Reprocessing (EMDR) [phone] [address] [place], [place] [address]

## 2024-01-02 NOTE — Progress Notes (Signed)
 [facility]  Phone: [phone]  - Medical Office Visit -  Visit Date: 01/02/2024 Patient: Jill Ashley   DOB: 05/21/1965   58 y.o. Female  MRN: [id number] Patient Care Team: Jesus Bernardino MATSU, MD as PCP - General (Internal Medicine) Patient, No Pcp Per (General Practice) Today's Health Care Provider: Bernardino MATSU Jesus, MD  ===========================================    Chief Complaint / Reason for Visit: New Pt (Pt present to est care with pcp) and Medication Refill  Background: 58 y.o. female who has Cough variant asthma; Allergic rhinitis; Hypertension; Vitamin D  deficiency; and Postmenopausal estrogen deficiency on their problem list.  Discussed the use of AI scribe software for clinical note transcription with the patient, who gave verbal consent to proceed.  History of Present Illness 58 year old female who presents to establish care and for medication refills.  She is currently undergoing treatment for complex PTSD with Spravato, attending two appointments per week and receiving 84 mg twice weekly. She finds this treatment effective. She has not tried EMDR therapy.  Her current medications include Symbicort  daily, Ventolin  as needed, amlodipine  daily, vitamin C daily, biotin daily, Wellbutrin daily, buspirone 15 mg twice daily, vitamin D , and Celexa 20 mg daily. She has recently started buspirone and plans to begin with the prescribed dosage. Spravato is administered as three sprays in the nose twice a week.  She has a history of asthma and uses a nebulizer at home. She is currently taking Singulair .  She has a history of insomnia and takes trazodone  for sleep, which is managed by her psychiatrist. She describes needing a combination of medications to sleep.  She has a history of menopause and is on estrogen replacement therapy.  She has received various vaccinations including shingles, tetanus, and pneumonia last year and is considering a flu shot  today. She has a history of high cholesterol.  She has a history of a thyroid nodule that was biopsied and found to be non-cancerous. She does not recall any thyroid problems.  No allergies to latex gloves or eggs.  Medications updated/reviewed: Current Outpatient Medications on File Prior to Visit  Medication Sig   Ascorbic Acid (VITAMIN C PO) Take by mouth.   BIOTIN PO Take by mouth.   buPROPion (WELLBUTRIN SR) 200 MG 12 hr tablet Take 200 mg by mouth every morning.   busPIRone (BUSPAR) 15 MG tablet Take 15 mg by mouth 2 (two) times daily.   citalopram (CELEXA) 20 MG tablet Take 20 mg by mouth every morning.   Esketamine HCl, 84 MG Dose, (SPRAVATO, 84 MG DOSE,) 28 MG/DEVICE SOPK Place 84 mg into the nose 2 (two) times a week.   Norethindrone-Ethinyl Estradiol -Fe Biphas (LO LOESTRIN FE ) 1 MG-10 MCG / 10 MCG tablet TAKE 1 TABLET BY MOUTH ONCE A DAY   progesterone  (PROMETRIUM ) 100 MG capsule Take 1 capsule (100 mg total) by mouth daily as directed   traZODone  (DESYREL ) 50 MG tablet Take 1 tablet (50 mg total) by mouth at bedtime as needed.   triamcinolone  (NASACORT  ALLERGY 24HR) 55 MCG/ACT AERO nasal inhaler 1 spray in each nostril Nasally Once a day   No current facility-administered medications on file prior to visit.   Medications Discontinued During This Encounter  Medication Reason   benzonatate  (TESSALON ) 200 MG capsule    busPIRone (BUSPAR) 5 MG tablet Dose change   COVID-19 At Home Antigen Test [facility] COVID-19 HOME TEST) KIT    COVID-19 mRNA vaccine 2023-2024 (COMIRNATY ) syringe    estradiol  (VIVELLE -DOT)  0.1 MG/24HR patch    ibuprofen  (ADVIL ) 800 MG tablet    Esketamine HCl, 84 MG Dose, (SPRAVATO, 84 MG DOSE,) 28 MG/DEVICE SOPK Completed Course   influenza vac split quadrivalent PF (FLUARIX  QUADRIVALENT) 0.5 ML injection Completed Course   Zoster Vaccine Adjuvanted (SHINGRIX ) injection Completed Course   Zoster Vaccine Adjuvanted (SHINGRIX ) injection Completed Course    Cholecalciferol (VITAMIN D3 PO) Reorder   amLODipine  (NORVASC ) 5 MG tablet Reorder   albuterol  (VENTOLIN  HFA) 108 (90 Base) MCG/ACT inhaler Reorder   montelukast  (SINGULAIR ) 10 MG tablet Reorder   ipratropium-albuterol  (DUONEB) 0.5-2.5 (3) MG/3ML SOLN Reorder   budesonide -formoterol  (SYMBICORT ) 160-4.5 MCG/ACT inhaler Reorder   Current Meds  Medication Sig   Ascorbic Acid (VITAMIN C PO) Take by mouth.   BIOTIN PO Take by mouth.   buPROPion (WELLBUTRIN SR) 200 MG 12 hr tablet Take 200 mg by mouth every morning.   busPIRone (BUSPAR) 15 MG tablet Take 15 mg by mouth 2 (two) times daily.   citalopram (CELEXA) 20 MG tablet Take 20 mg by mouth every morning.   Esketamine HCl, 84 MG Dose, (SPRAVATO, 84 MG DOSE,) 28 MG/DEVICE SOPK Place 84 mg into the nose 2 (two) times a week.   multivitamin (CENTRUM) chewable tablet Chew 1 tablet by mouth daily.   Norethindrone-Ethinyl Estradiol -Fe Biphas (LO LOESTRIN FE ) 1 MG-10 MCG / 10 MCG tablet TAKE 1 TABLET BY MOUTH ONCE A DAY   progesterone  (PROMETRIUM ) 100 MG capsule Take 1 capsule (100 mg total) by mouth daily as directed   traZODone  (DESYREL ) 50 MG tablet Take 1 tablet (50 mg total) by mouth at bedtime as needed.   triamcinolone  (NASACORT  ALLERGY 24HR) 55 MCG/ACT AERO nasal inhaler 1 spray in each nostril Nasally Once a day   [DISCONTINUED] albuterol  (VENTOLIN  HFA) 108 (90 Base) MCG/ACT inhaler SMARTSIG:2 Puff(s) By Mouth Every 4 Hours PRN   [DISCONTINUED] amLODipine  (NORVASC ) 5 MG tablet TAKE 1 TABLET BY MOUTH ONCE A DAY   [DISCONTINUED] budesonide -formoterol  (SYMBICORT ) 160-4.5 MCG/ACT inhaler Inhale 2 puffs into the lungs in the morning and at bedtime.   [DISCONTINUED] busPIRone (BUSPAR) 5 MG tablet Take 5 mg by mouth.   [DISCONTINUED] Cholecalciferol (VITAMIN D3 PO) Take by mouth.   [DISCONTINUED] Esketamine HCl, 84 MG Dose, (SPRAVATO, 84 MG DOSE,) 28 MG/DEVICE SOPK 3 sprays in each nostril Nasally once a week   [DISCONTINUED] ipratropium-albuterol   (DUONEB) 0.5-2.5 (3) MG/3ML SOLN Take 3 mLs by nebulization every 6 (six) hours as needed.   [DISCONTINUED] montelukast  (SINGULAIR ) 10 MG tablet Take 10 mg by mouth daily.    Allergies:  Penicillins and Morphine and codeine Past Medical History:  has a past medical history of Allergy, Anxiety, Asthma (08/16/2023), Depression, High blood pressure, Hyperlipidemia, Recurrent urticaria (05/25/2019), Upper airway cough syndrome (08/16/2023), and Urticaria. Past Surgical History:   has a past surgical history that includes Adenoidectomy; Tonsillectomy; Breast excisional biopsy (Left, 2008); and Cesarean section. Social History:   reports that she quit smoking about 19 years ago. Her smoking use included cigarettes. She started smoking about 30 years ago. She has a 2.8 pack-year smoking history. She has never used smokeless tobacco. She reports that she does not currently use alcohol. She reports that she does not use drugs. Family History:  family history includes Diabetes in her father; Hypertension in her father; Varicose Veins in her maternal grandmother and mother. Depression Screen and Health Maintenance:    01/02/2024   10:39 AM  PHQ 2/9 Scores  PHQ - 2 Score 3  PHQ-  9 Score 13   Health Maintenance  Topic Date Due   Hepatitis B Vaccines 19-59 Average Risk (1 of 3 - 19+ 3-dose series) Never done   Cervical Cancer Screening (HPV/Pap Cotest)  Never done   Zoster Vaccines- Shingrix  (2 of 2) 03/13/2022   Hepatitis C Screening  01/01/2025 (Originally 11/13/1983)   HIV Screening  01/01/2025 (Originally 11/12/1980)   Mammogram  01/22/2025   DTaP/Tdap/Td (2 - Td or Tdap) 06/06/2030   Colonoscopy  11/30/2031   Pneumococcal Vaccine: 50+ Years  Completed   Influenza Vaccine  Completed   COVID-19 Vaccine  Completed   HPV VACCINES  Aged Out   Meningococcal B Vaccine  Aged Out   Immunization History  Administered Date(s) Administered   Influenza, Seasonal, Injecte, Preservative Fre 01/14/2023,  01/02/2024   Influenza,inj,Quad PF,6+ Mos 02/24/2020, 01/31/2022   PFIZER(Purple Top)SARS-COV-2 Vaccination 07/10/2019, 07/31/2019, 02/04/2020   PNEUMOCOCCAL CONJUGATE-20 06/25/2023   Pfizer(Comirnaty )Fall Seasonal Vaccine 12 years and older 01/10/2021, 04/24/2022, 01/14/2023   Tdap 06/06/2020   Zoster Recombinant(Shingrix ) 01/16/2022   Zoster, Live 07/19/2021, 01/16/2022     Objective   Physical ExamBP 126/80   Pulse 78   Temp 98 F (36.7 C) (Temporal)   Ht 5' 2 (1.575 m)   Wt 154 lb 6.4 oz (70 kg)   SpO2 98%   BMI 28.24 kg/m  Wt Readings from Last 10 Encounters:  01/02/24 154 lb 6.4 oz (70 kg)  08/15/23 156 lb 9.6 oz (71 kg)  07/21/23 157 lb (71.2 kg)  09/18/22 156 lb 12 oz (71.1 kg)  07/06/22 156 lb 12.8 oz (71.1 kg)  11/15/21 145 lb 11.2 oz (66.1 kg)  05/25/19 155 lb 6.4 oz (70.5 kg)  Vital signs reviewed.  Nursing notes reviewed. Weight trend reviewed. General Appearance:  Well developed, well nourished, well-groomed, healthy-appearing female with Body mass index is 28.24 kg/m. No acute distress appreciable.   Skin: Clear and well-hydrated. Pulmonary:  Normal work of breathing at rest, no respiratory distress apparent. SpO2: 98 %  Musculoskeletal: She demonstrates smooth and coordinated movements throughout all major joints.All extremities are intact.  Neurological:  Awake, alert, oriented, and engaged.  No obvious focal neurological deficits or cognitive impairments.  Sensorium seems unclouded.  Psychiatric:  Appropriate mood, pleasant and cooperative demeanor, cheerful and engaged during the exam  Results for orders placed or performed in visit on 01/02/24  Lipid panel  Result Value Ref Range   Cholesterol 232 (H) 0 - 200 mg/dL   Triglycerides 834.9 (H) 0.0 - 149.0 mg/dL   HDL 44.99 >60.99 mg/dL   VLDL 66.9 0.0 - 59.9 mg/dL   LDL Cholesterol 855 (H) 0 - 99 mg/dL   Total CHOL/HDL Ratio 4    NonHDL 177.07   Comprehensive metabolic panel with GFR  Result Value Ref  Range   Sodium 138 135 - 145 mEq/L   Potassium 4.4 3.5 - 5.1 mEq/L   Chloride 104 96 - 112 mEq/L   CO2 29 19 - 32 mEq/L   Glucose, Bld 83 70 - 99 mg/dL   BUN 14 6 - 23 mg/dL   Creatinine, Ser 9.11 0.40 - 1.20 mg/dL   Total Bilirubin 0.4 0.2 - 1.2 mg/dL   Alkaline Phosphatase 34 (L) 39 - 117 U/L   AST 14 0 - 37 U/L   ALT 10 0 - 35 U/L   Total Protein 6.8 6.0 - 8.3 g/dL   Albumin 4.1 3.5 - 5.2 g/dL   GFR 27.41 >39.99 mL/min   Calcium 9.3  8.4 - 10.5 mg/dL  CBC with Differential/Platelet  Result Value Ref Range   WBC 3.9 (L) 4.0 - 10.5 K/uL   RBC 4.62 3.87 - 5.11 Mil/uL   Hemoglobin 14.0 12.0 - 15.0 g/dL   HCT 57.9 63.9 - 53.9 %   MCV 90.8 78.0 - 100.0 fl   MCHC 33.3 30.0 - 36.0 g/dL   RDW 86.9 88.4 - 84.4 %   Platelets 224.0 150.0 - 400.0 K/uL   Neutrophils Relative % 51.0 43.0 - 77.0 %   Lymphocytes Relative 35.2 12.0 - 46.0 %   Monocytes Relative 9.9 3.0 - 12.0 %   Eosinophils Relative 3.2 0.0 - 5.0 %   Basophils Relative 0.7 0.0 - 3.0 %   Neutro Abs 2.0 1.4 - 7.7 K/uL   Lymphs Abs 1.4 0.7 - 4.0 K/uL   Monocytes Absolute 0.4 0.1 - 1.0 K/uL   Eosinophils Absolute 0.1 0.0 - 0.7 K/uL   Basophils Absolute 0.0 0.0 - 0.1 K/uL  TSH Rfx on Abnormal to Free T4  Result Value Ref Range   TSH 1.280 0.450 - 4.500 uIU/mL  Vitamin D  (25 hydroxy)  Result Value Ref Range   VITD 66.88 30.00 - 100.00 ng/mL    Office Visit on 01/02/2024  Component Date Value   Cholesterol 01/02/2024 232 (H)    Triglycerides 01/02/2024 165.0 (H)    HDL 01/02/2024 55.00    VLDL 01/02/2024 33.0    LDL Cholesterol 01/02/2024 144 (H)    Total CHOL/HDL Ratio 01/02/2024 4    NonHDL 01/02/2024 177.07    Sodium 01/02/2024 138    Potassium 01/02/2024 4.4    Chloride 01/02/2024 104    CO2 01/02/2024 29    Glucose, Bld 01/02/2024 83    BUN 01/02/2024 14    Creatinine, Ser 01/02/2024 0.88    Total Bilirubin 01/02/2024 0.4    Alkaline Phosphatase 01/02/2024 34 (L)    AST 01/02/2024 14    ALT 01/02/2024 10     Total Protein 01/02/2024 6.8    Albumin 01/02/2024 4.1    GFR 01/02/2024 72.58    Calcium 01/02/2024 9.3    WBC 01/02/2024 3.9 (L)    RBC 01/02/2024 4.62    Hemoglobin 01/02/2024 14.0    HCT 01/02/2024 42.0    MCV 01/02/2024 90.8    MCHC 01/02/2024 33.3    RDW 01/02/2024 13.0    Platelets 01/02/2024 224.0    Neutrophils Relative % 01/02/2024 51.0    Lymphocytes Relative 01/02/2024 35.2    Monocytes Relative 01/02/2024 9.9    Eosinophils Relative 01/02/2024 3.2    Basophils Relative 01/02/2024 0.7    Neutro Abs 01/02/2024 2.0    Lymphs Abs 01/02/2024 1.4    Monocytes Absolute 01/02/2024 0.4    Eosinophils Absolute 01/02/2024 0.1    Basophils Absolute 01/02/2024 0.0    TSH 01/02/2024 1.280    VITD 01/02/2024 66.88   Hospital Outpatient Visit on 08/15/2023  Component Date Value   WBC 08/15/2023 5.0    RBC 08/15/2023 4.51    Hemoglobin 08/15/2023 13.7    HCT 08/15/2023 41.0    MCV 08/15/2023 90.9    MCH 08/15/2023 30.4    MCHC 08/15/2023 33.4    RDW 08/15/2023 13.6    Platelets 08/15/2023 238    nRBC 08/15/2023 0.0    Neutrophils Relative % 08/15/2023 57    Neutro Abs 08/15/2023 2.9    Lymphocytes Relative 08/15/2023 30    Lymphs Abs 08/15/2023 1.5    Monocytes Relative 08/15/2023  10    Monocytes Absolute 08/15/2023 0.5    Eosinophils Relative 08/15/2023 2    Eosinophils Absolute 08/15/2023 0.1    Basophils Relative 08/15/2023 1    Basophils Absolute 08/15/2023 0.0    Immature Granulocytes 08/15/2023 0    Abs Immature Granulocytes 08/15/2023 0.00    Class Description Allerg* 08/15/2023 Comment    IgE (Immunoglobulin E), * 08/15/2023 24    D Pteronyssinus IgE 08/15/2023 <0.10    D Farinae IgE 08/15/2023 <0.10    Cat Dander IgE 08/15/2023 <0.10    Dog Dander IgE 08/15/2023 <0.10    [place] Grass IgE 08/15/2023 <0.10    Timothy Grass IgE 08/15/2023 <0.10    [place]  Bluegrass IgE 08/15/2023 <0.10    [place] Grass IgE 08/15/2023 <0.10    [place] Grass IgE 08/15/2023  <0.10    Cockroach, American IgE 08/15/2023 <0.10    Penicillium Chrysogen IgE 08/15/2023 <0.10    Cladosporium Herbarum IgE 08/15/2023 <0.10    Aspergillus Fumigatus IgE 08/15/2023 <0.10    Mucor Racemosus IgE 08/15/2023 <0.10    Alternaria Alternata IgE 08/15/2023 <0.10    Setomelanomma Rostrat 08/15/2023 <0.10    Oak, White IgE 08/15/2023 <0.10    Elm, American IgE 08/15/2023 <0.10    Maple/Box Elder IgE 08/15/2023 <0.10    Common Silver Birch IgE 08/15/2023 <0.10    Hickory, White IgE 08/15/2023 <0.10    White Mulberry IgE 08/15/2023 <0.10    Cedar, [place] IgE 08/15/2023 <0.10    Ragweed, Short IgE 08/15/2023 <0.10    Plantain, English IgE 08/15/2023 <0.10    Cocklebur IgE 08/15/2023 <0.10    Pigweed, Rough IgE 08/15/2023 <0.10   Office Visit on 08/15/2023  Component Date Value   Nitric Oxide  08/15/2023 23   Admission on 07/21/2023, Discharged on 07/21/2023  Component Date Value   Sodium 07/21/2023 136    Potassium 07/21/2023 3.7    Chloride 07/21/2023 103    CO2 07/21/2023 25    Glucose, Bld 07/21/2023 103 (H)    BUN 07/21/2023 18    Creatinine, Ser 07/21/2023 0.78    Calcium 07/21/2023 9.4    GFR, Estimated 07/21/2023 >60    Anion gap 07/21/2023 8    WBC 07/21/2023 5.2    RBC 07/21/2023 4.27    Hemoglobin 07/21/2023 12.8    HCT 07/21/2023 37.9    MCV 07/21/2023 88.8    MCH 07/21/2023 30.0    MCHC 07/21/2023 33.8    RDW 07/21/2023 13.4    Platelets 07/21/2023 236    nRBC 07/21/2023 0.0    Troponin I (High Sensiti* 07/21/2023 <2    No image results found.   No results found.  No results found.    ASSESSMENT & PLAN   Assessment & Plan Mild persistent asthma with acute exacerbation Her asthma is well-controlled with daily Symbicort  and Ventolin  as needed. She prefers primary care management over lung specialist follow-up. Refill Symbicort  and Ventolin . Administer flu shot. Allergic rhinitis, unspecified seasonality, unspecified trigger  Hypertension,  unspecified type Her hypertension is well-controlled on the current medication regimen. Refill amlodipine . Immunization due Need for immunization against influenza Vaccine given Abnormally low high density lipoprotein (HDL) cholesterol with hypertriglyceridemia Blood work is needed to assess current cholesterol levels. Order a fasting lipid panel. Chronic post-traumatic stress disorder (PTSD) Complex post-traumatic stress disorder (CPTSD) with insomnia   She has severe CPTSD with associated insomnia, likely related to her condition. She is currently on Spravato twice weekly. Continue Spravato as prescribed. Discuss EMDR therapy with  her therapist for potential synergy with Spravato. Consider CBT-I for insomnia and DIY EMDR techniques such as buzzers or tapping. Counseling for estrogen replacement therapy Vitamin D  deficiency Postmenopausal estrogen deficiency Her vitamin D  deficiency is being treated with supplementation. Refill vitamin D . She is currently on estrogen replacement therapy. Discussed risks of long-term use, including increased risk of stroke, endometrial cancer, and breast cancer. Benefits include improved skin, mood, and sexual activity. Recommended discontinuation within one to two years due to increased risks. Discuss discontinuation of estrogen therapy with her gynecologist. Monitor for side effects and risks.  ORDER ASSOCIATIONS  #   DIAGNOSIS / CONDITION ICD-10 ENCOUNTER ORDER     ICD-10-CM   1. Allergic rhinitis, unspecified seasonality, unspecified trigger  J30.9 albuterol  (VENTOLIN  HFA) 108 (90 Base) MCG/ACT inhaler    multivitamin (CENTRUM) chewable tablet    2. Mild persistent asthma with acute exacerbation  J45.31 ipratropium-albuterol  (DUONEB) 0.5-2.5 (3) MG/3ML SOLN    montelukast  (SINGULAIR ) 10 MG tablet    budesonide -formoterol  (SYMBICORT ) 160-4.5 MCG/ACT inhaler    multivitamin (CENTRUM) chewable tablet    3. Hypertension, unspecified type  I10 amLODipine   (NORVASC ) 5 MG tablet    multivitamin (CENTRUM) chewable tablet    Comprehensive metabolic panel with GFR    CBC with Differential/Platelet    4. Vitamin D  deficiency  E55.9 cholecalciferol (VITAMIN D3) 25 MCG (1000 UNIT) tablet    multivitamin (CENTRUM) chewable tablet    Vitamin D  (25 hydroxy)    5. Postmenopausal estrogen deficiency  Z78.0 multivitamin (CENTRUM) chewable tablet    6. Need for immunization against influenza  Z23 CANCELED: Flu vaccine HIGH DOSE PF(Fluzone Trivalent)    7. Abnormally low high density lipoprotein (HDL) cholesterol with hypertriglyceridemia  E78.6 Lipid panel   E78.1     8. Chronic post-traumatic stress disorder (PTSD)  F43.12 TSH Rfx on Abnormal to Free T4    9. Immunization due  Z23 Flu vaccine trivalent PF, 6mos and older(Flulaval ,Afluria,Fluarix ,Fluzone)    10. Counseling for estrogen replacement therapy  Z71.89      Diagnoses and all orders for this visit: Allergic rhinitis, unspecified seasonality, unspecified trigger -     albuterol  (VENTOLIN  HFA) 108 (90 Base) MCG/ACT inhaler; Inhale 2 puffs into the lungs every 4 (four) hours as needed for wheezing or shortness of breath. -     multivitamin (CENTRUM) chewable tablet; Chew 1 tablet by mouth daily. Mild persistent asthma with acute exacerbation -     ipratropium-albuterol  (DUONEB) 0.5-2.5 (3) MG/3ML SOLN; Take 3 mLs by nebulization every 6 (six) hours as needed. -     montelukast  (SINGULAIR ) 10 MG tablet; Take 1 tablet (10 mg total) by mouth daily. -     budesonide -formoterol  (SYMBICORT ) 160-4.5 MCG/ACT inhaler; Inhale 2 puffs into the lungs in the morning and at bedtime. -     multivitamin (CENTRUM) chewable tablet; Chew 1 tablet by mouth daily. Hypertension, unspecified type -     amLODipine  (NORVASC ) 5 MG tablet; TAKE 1 TABLET BY MOUTH ONCE A DAY -     multivitamin (CENTRUM) chewable tablet; Chew 1 tablet by mouth daily. -     Comprehensive metabolic panel with GFR -     CBC with  Differential/Platelet Vitamin D  deficiency -     cholecalciferol (VITAMIN D3) 25 MCG (1000 UNIT) tablet; Take 1 tablet (1,000 Units total) by mouth daily at 6 (six) AM. -     multivitamin (CENTRUM) chewable tablet; Chew 1 tablet by mouth daily. -     Vitamin D  (  25 hydroxy) Postmenopausal estrogen deficiency -     multivitamin (CENTRUM) chewable tablet; Chew 1 tablet by mouth daily. Need for immunization against influenza Abnormally low high density lipoprotein (HDL) cholesterol with hypertriglyceridemia -     Lipid panel Chronic post-traumatic stress disorder (PTSD) -     TSH Rfx on Abnormal to Free T4 Immunization due -     Flu vaccine trivalent PF, 6mos and older(Flulaval ,Afluria,Fluarix ,Fluzone) Counseling for estrogen replacement therapy      Additional notes: This document was synthesized by artificial intelligence ([employer]bridge) using HIPAA-compliant recording of the clinical interaction;   We discussed the use of AI scribe software for clinical note transcription with the patient, who gave verbal consent to proceed.    Additional Info: This encounter employed state-of-the-art, real-time, collaborative documentation. The patient actively reviewed and assisted in updating their electronic medical record on a shared screen, ensuring transparency and facilitating joint problem-solving for the problem list, overview, and plan. This approach promotes accurate, informed care. The treatment plan was discussed and reviewed in detail, including medication safety, potential side effects, and all patient questions. We confirmed understanding and comfort with the plan. Follow-up instructions were established, including contacting the office for any concerns, returning if symptoms worsen, persist, or new symptoms develop, and precautions for potential emergency department visits.  Initial Appointment Goals:  This initial visit focused on establishing a foundation for the patient's care. We collaboratively  reviewed her medical history and medications in detail, updating the chart as shown in the encounter. Given the extensive information, we prioritized addressing her most pressing concerns, which she reported were: New Pt (Pt present to est care with pcp) and Medication Refill  While the complexity of the patient's medical picture may necessitate further evaluation in subsequent visits, we were able to develop a preliminary care plan together. To expedite a comprehensive plan at the next visit, we encouraged the patient to gather relevant medical records from previous providers. This collaborative approach will ensure a more complete understanding of the patient's health and inform the development of a personalized care plan. We look forward to continuing the conversation and working together with the patient on achieving her health goals.   Collaborative Documentation:  Today's encounter utilized real-time, dynamic patient engagement.  Patients actively participate by directly reviewing and assisting in updating their medical records through a shared screen. This transparency empowers patients to visually confirm chart updates made by the healthcare provider.  This collaborative approach facilitates problem management as we jointly update the problem list, problem overview, and assessment/plan. Ultimately, this process enhances chart accuracy and completeness, fostering shared decision-making, patient education, and informed consent for tests and treatments.  Collaborative Treatment Planning:  Treatment plans were discussed and reviewed in detail.  Explained medication safety and potential side effects.  Encouraged participation and answered all patient questions, confirming understanding and comfort with the plan. Encouraged patient to contact our office if they have any questions or concerns. Agreed on patient returning to office if symptoms worsen, persist, or new symptoms develop.   ----------------------------------------------------- Bernardino KANDICE Cone, MD  01/03/2024 10:57 PM  [facility]:  [phone]

## 2024-01-03 DIAGNOSIS — F332 Major depressive disorder, recurrent severe without psychotic features: Secondary | ICD-10-CM | POA: Diagnosis not present

## 2024-01-03 LAB — TSH RFX ON ABNORMAL TO FREE T4: TSH: 1.28 u[IU]/mL (ref 0.450–4.500)

## 2024-01-03 NOTE — Assessment & Plan Note (Signed)
 Her vitamin D  deficiency is being treated with supplementation. Refill vitamin D . She is currently on estrogen replacement therapy. Discussed risks of long-term use, including increased risk of stroke, endometrial cancer, and breast cancer. Benefits include improved skin, mood, and sexual activity. Recommended discontinuation within one to two years due to increased risks. Discuss discontinuation of estrogen therapy with her gynecologist. Monitor for side effects and risks.

## 2024-01-03 NOTE — Assessment & Plan Note (Signed)
 Her hypertension is well-controlled on the current medication regimen. Refill amlodipine .

## 2024-01-05 ENCOUNTER — Ambulatory Visit: Payer: Self-pay | Admitting: Internal Medicine

## 2024-01-05 NOTE — Progress Notes (Signed)
 Reviewed, all essentially normal except cholesterol.  Message shared to patient.

## 2024-01-06 NOTE — Progress Notes (Signed)
Results seen in my chart 

## 2024-01-07 DIAGNOSIS — F4312 Post-traumatic stress disorder, chronic: Secondary | ICD-10-CM | POA: Diagnosis not present

## 2024-01-07 DIAGNOSIS — F332 Major depressive disorder, recurrent severe without psychotic features: Secondary | ICD-10-CM | POA: Diagnosis not present

## 2024-01-08 DIAGNOSIS — F332 Major depressive disorder, recurrent severe without psychotic features: Secondary | ICD-10-CM | POA: Diagnosis not present

## 2024-01-09 DIAGNOSIS — F332 Major depressive disorder, recurrent severe without psychotic features: Secondary | ICD-10-CM | POA: Diagnosis not present

## 2024-01-13 DIAGNOSIS — F332 Major depressive disorder, recurrent severe without psychotic features: Secondary | ICD-10-CM | POA: Diagnosis not present

## 2024-01-16 DIAGNOSIS — F332 Major depressive disorder, recurrent severe without psychotic features: Secondary | ICD-10-CM | POA: Diagnosis not present

## 2024-01-20 ENCOUNTER — Ambulatory Visit: Payer: Self-pay

## 2024-01-20 NOTE — Telephone Encounter (Signed)
 Call dropped prior to transfer to this RN Called patient back for triage Appointment made for tomorrow 01/21/2024 at 10:20am with patient's PCP Dr Bernardino Cone Patient was offered an appointment at a surrounding location within the region for today but she wanted to wait until tomorrow to see her PCP  FYI Only or Action Required?: FYI only for provider.  Patient was last seen in primary care on 01/02/2024 by Cone Bernardino MATSU, MD.  Called Nurse Triage reporting Cough.  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  Interventions attempted: OTC medications: Vitamin C, vitamins, Prescription medications: inhaler, and Rest, hydration, or home remedies.  Symptoms are: gradually worsening.  Triage Disposition: See Physician Within 24 Hours  Patient/caregiver understands and will follow disposition?: Yes                 Copied from CRM #[id number]. 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 Patient verbalized understanding.   1. ONSET: When did the cough begin?      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 SEVERITY: How bad is the cough today?       persistent 3. SPUTUM: Describe the color of your sputum (e.g., none, dry cough; clear, white, yellow, green)     N/a 4. HEMOPTYSIS: Are you coughing up any blood? If Yes, ask: How much? (e.g., flecks, streaks, tablespoons, etc.)     No 5. DIFFICULTY BREATHING: Are you having difficulty breathing? If Yes, ask: How bad is it? (e.g., mild, moderate, severe)      Patient states she just feels tight--used her inhaler & needs to find the tubing for her nebulizer 6. FEVER: Do you have a fever? If Yes, ask: What is your temperature, how was it measured, and when did it start?     unsure 7. CARDIAC HISTORY: Do you have any history of heart disease? (e.g., heart attack, congestive heart failure)      ----- 8. LUNG HISTORY: Do you have any history of lung disease?  (e.g., pulmonary embolus, asthma, emphysema)     Asthma 9. PE RISK FACTORS: Do you have a history of blood clots? (or: recent major surgery, recent prolonged travel, bedridden)     no 10. OTHER SYMPTOMS: Do you have any other symptoms? (e.g., runny nose, wheezing, chest pain)       Irritated throat, chest has felt tight for the past few days--denies pain  Protocols used: Cough - Acute Non-Productive-A-AH

## 2024-01-21 ENCOUNTER — Ambulatory Visit (INDEPENDENT_AMBULATORY_CARE_PROVIDER_SITE_OTHER): Admitting: Internal Medicine

## 2024-01-21 ENCOUNTER — Encounter: Payer: Self-pay | Admitting: Internal Medicine

## 2024-01-21 VITALS — BP 120/76 | HR 77 | Temp 98.5°F | Ht 62.0 in | Wt 153.2 lb

## 2024-01-21 DIAGNOSIS — R051 Acute cough: Secondary | ICD-10-CM | POA: Diagnosis not present

## 2024-01-21 DIAGNOSIS — B3731 Acute candidiasis of vulva and vagina: Secondary | ICD-10-CM

## 2024-01-21 DIAGNOSIS — H6690 Otitis media, unspecified, unspecified ear: Secondary | ICD-10-CM

## 2024-01-21 DIAGNOSIS — F332 Major depressive disorder, recurrent severe without psychotic features: Secondary | ICD-10-CM | POA: Diagnosis not present

## 2024-01-21 DIAGNOSIS — R0981 Nasal congestion: Secondary | ICD-10-CM

## 2024-01-21 LAB — POC COVID19 BINAXNOW: SARS Coronavirus 2 Ag: NEGATIVE

## 2024-01-21 LAB — POCT INFLUENZA A/B
Influenza A, POC: NEGATIVE
Influenza B, POC: NEGATIVE

## 2024-01-21 MED ORDER — DEXTROMETHORPHAN-GUAIFENESIN 10-100 MG/5ML PO LIQD: 10.0000 mL | ORAL | 1 refills | Status: AC | PRN

## 2024-01-21 MED ORDER — PSEUDOEPHEDRINE HCL ER 120 MG PO TB12: 120.0000 mg | ORAL_TABLET | Freq: Two times a day (BID) | ORAL | 0 refills | Status: AC

## 2024-01-21 MED ORDER — BENZONATATE 100 MG PO CAPS: 100.0000 mg | ORAL_CAPSULE | Freq: Two times a day (BID) | ORAL | 0 refills | Status: AC | PRN

## 2024-01-21 MED ORDER — FLUCONAZOLE 150 MG PO TABS: 150.0000 mg | ORAL_TABLET | ORAL | 0 refills | Status: AC | PRN

## 2024-01-21 MED ORDER — LEVOCETIRIZINE DIHYDROCHLORIDE 5 MG PO TABS: 5.0000 mg | ORAL_TABLET | Freq: Every evening | ORAL | 2 refills | Status: AC

## 2024-01-21 MED ORDER — DOXYCYCLINE HYCLATE 100 MG PO TABS: 100.0000 mg | ORAL_TABLET | Freq: Two times a day (BID) | ORAL | 0 refills | Status: AC

## 2024-01-21 NOTE — Patient Instructions (Addendum)
 It was a pleasure seeing you today! Your health and satisfaction are our top priorities.  Bernardino Cone, MD  VISIT SUMMARY: During your visit, we discussed your recent symptoms of dry cough, congestion, sore throat, ear pressure, and mucus production. You have been feeling unwell since Wednesday, with symptoms worsening by Friday. We reviewed your current treatments and discussed additional options to help manage your symptoms and prevent further complications.  YOUR PLAN: -ACUTE UPPER RESPIRATORY INFECTION WITH SINUS CONGESTION AND EUSTACHIAN TUBE DYSFUNCTION WITH MIDDLE EAR EFFUSION: You have an acute upper respiratory infection, which is likely caused by a virus such as the common cold. This has led to significant sinus congestion and blockage of the Eustachian tube, causing fluid buildup behind your eardrum. To manage your symptoms, I recommend using Sudafed to reduce mucus production and congestion, and dextromethorphan to suppress your cough. Use saline nasal spray for sinus rinsing to help clear congestion and improve drainage. Avoid using neti pots due to sterility concerns and use saline misting sprays instead. If your ear pain returns or your symptoms persist beyond 10 days, I will prescribe Augmentin to treat a possible bacterial infection. Additionally, I will prescribe Monistat to prevent a yeast infection if you need to take antibiotics. Be aware of potential side effects of antibiotics, including the risk of C. difficile infection.  INSTRUCTIONS: Please follow the treatment plan we discussed, including the use of Sudafed, dextromethorphan, and saline nasal spray. Avoid using neti pots and opt for saline misting sprays instead. If your symptoms do not improve or if your ear pain returns, please contact me for a prescription of Augmentin. I have provided a work excuse for October 4th to 6th due to your illness. If you have any concerns or if your symptoms worsen, please schedule a follow-up  appointment.  Your Providers PCP: Cone Bernardino MATSU, MD,  [phone]) Referring Provider: Cone Bernardino MATSU, MD,  [phone]) Care Team Provider: Patient, No Pcp Per  NEXT STEPS: [x]  Early Intervention: Schedule sooner appointment, call our on-call services, or go to emergency room if there is any significant Increase in pain or discomfort New or worsening symptoms Sudden or severe changes in your health [x]  Flexible Follow-Up: We recommend a No follow-ups on file. for optimal routine care. This allows for progress monitoring and treatment adjustments. [x]  Preventive Care: Schedule your annual preventive care visit! It's typically covered by insurance and helps identify potential health issues early. [x]  Lab & X-ray Appointments: Incomplete tests scheduled today, or call to schedule. X-rays: [facility] (M-F, 8:30am-noon or 1pm-5pm). [x]  Medical Information Release: Sign a release form at front desk to obtain relevant medical information we don't have.  MAKING THE MOST OF OUR FOCUSED 20 MINUTE APPOINTMENTS: [x]   Clearly state your top concerns at the beginning of the visit to focus our discussion [x]   If you anticipate you will need more time, please inform the front desk during scheduling - we can book multiple appointments in the same week. [x]   If you have transportation problems- use our convenient video appointments or ask about transportation support. [x]   We can get down to business faster if you use MyChart to update information before the visit and submit non-urgent questions before your visit. Thank you for taking the time to provide details through MyChart.  Let our nurse know and she can import this information into your encounter documents.  Arrival and Wait Times: [x]   Arriving on time ensures that everyone receives prompt attention. [x]   Early  morning (8a) and afternoon (1p) appointments tend to have shortest wait times. [x]   Unfortunately, we cannot  delay appointments for late arrivals or hold slots during phone calls.  Getting Answers and Following Up [x]   Simple Questions & Concerns: For quick questions or basic follow-up after your visit, reach us  at [phone] or MyChart messaging. [x]   Complex Concerns: If your concern is more complex, scheduling an appointment might be best. Discuss this with the staff to find the most suitable option. [x]   Lab & Imaging Results: We'll contact you directly if results are abnormal or you don't use MyChart. Most normal results will be on MyChart within 2-3 business days, with a review message from Dr. Jesus. Haven't heard back in 2 weeks? Need results sooner? Contact us  at [phone]. [x]   Referrals: Our referral coordinator will manage specialist referrals. The specialist's office should contact you within 2 weeks to schedule an appointment. Call us  if you haven't heard from them after 2 weeks.  Staying Connected [x]   MyChart: Activate your MyChart for the fastest way to access results and message us . See the last page of this paperwork for instructions on how to activate.  Bring to Your Next Appointment [x]   Medications: Please bring all your medication bottles to your next appointment to ensure we have an accurate record of your prescriptions. [x]   Health Diaries: If you're monitoring any health conditions at home, keeping a diary of your readings can be very helpful for discussions at your next appointment.  Billing [x]   X-ray & Lab Orders: These are billed by separate companies. Contact the invoicing company directly for questions or concerns. [x]   Visit Charges: Discuss any billing inquiries with our administrative services team.  Your Satisfaction Matters [x]   Share Your Experience: We strive for your satisfaction! If you have any complaints, or preferably compliments, please let Dr. Jesus know directly or contact our Practice Administrators, Manuelita Rubin or [employer], by  asking at the front desk.   Reviewing Your Records [x]   Review this early draft of your clinical encounter notes below and the final encounter summary tomorrow on MyChart after its been completed.  All orders placed so far are visible here: Acute cough -     POC COVID-19 BinaxNow -     POCT Influenza A/B -     Pseudoephedrine HCl ER; Take 1 tablet (120 mg total) by mouth 2 (two) times daily. Discontinue if any shortness of breath, chest pain, or heartbeat irregularities.  Dispense: 20 tablet; Refill: 0 -     Dextromethorphan-guaiFENesin ; Take 10 mLs by mouth every 4 (four) hours as needed for cough.  Dispense: 120 mL; Refill: 1 -     Benzonatate ; Take 1 capsule (100 mg total) by mouth 2 (two) times daily as needed for cough.  Dispense: 20 capsule; Refill: 0 -     Levocetirizine Dihydrochloride ; Take 1 tablet (5 mg total) by mouth every evening.  Dispense: 30 tablet; Refill: 2  Nasal congestion -     POC COVID-19 BinaxNow -     POCT Influenza A/B  Chronic otitis media, unspecified otitis media type -     Doxycycline Hyclate; Take 1 tablet (100 mg total) by mouth 2 (two) times daily.  Dispense: 20 tablet; Refill: 0  Vaginal candidiasis -     Fluconazole; Take 1 tablet (150 mg total) by mouth every three (3) days as needed.  Dispense: 2 tablet; Refill: 0         ALLERGY MANAGEMENT  PLAN  This plan is designed to help manage your allergic rhinitis (nasal allergies) effectively. Follow these steps daily for best results.  Sinus saline sprays- use nightly, and after sneezing episodes or exposure to allergen.  Insert deeply and spray mist into nose while leaning over sink at 45 degrees,  while gently breathing. Also blow out onto tissue while leaning forward 45 degrees. Once daily, after a sinus rinse, use sensimist.  Just before bedtime is best. This only needed if allergies acting up.  If this is inadequate add-on once daily for levocetirizine / xyzal  5 mg for nondrowsy  antihistamine Take benadryl 25 mg at bedtime also if allergic mucus is persisting  When allergies cause chronic swelling in sinuses, it leads to sinus infections:    DAILY TREATMENT ROUTINE   Time of Day Treatment Steps  Morning 1. Saline Nasal Spray - Use to cleanse nasal passages 2. Xyzal  (levocetirizine) - Take one tablet daily   Throughout Day Saline Nasal Spray - Use 2 additional times (mid-day and afternoon)   Evening/Bedtime 1. Saline Nasal Rinse - Thoroughly clean nasal passages 2. Flonase Sensimist - Apply after nasal rinse 3. Benadryl (diphenhydramine) - Take 25mg  if experiencing persistent congestion    PROPER TECHNIQUE GUIDE       Saline Nasal Spray/Rinse Technique: Lean forward over sink at a 45-degree angle Turn head slightly to one side Insert spray tip into upper nostril Spray gently while breathing lightly through your nose Repeat on other side Gently blow nose to clear excess solution Use saline spray 3 times daily to keep nasal passages moist and clear allergens.       Flonase Sensimist Technique: Shake bottle gently before each use Prime the bottle if it's new or hasn't been used for a week Tilt your head forward slightly Insert tip into nostril, pointing away from the center of your nose Spray while inhaling gently Repeat in other nostril Use Flonase Sensimist once daily, preferably at bedtime after using saline rinse. It may take several days of regular use to feel maximum benefit.   WHY FLONASE SENSIMIST?   Benefits of Flonase Sensimist:  Alcohol-free and scent-free formula - gentler on sensitive nasal passages Fine mist application - more comfortable with less dripping down throat Effectively relieves nasal congestion, sneezing, runny nose, and even eye symptoms 24-hour relief with once-daily dosing Uses a more potent form of fluticasone that works at a lower dose Less liquid per spray means less discomfort  UNDERSTANDING YOUR MEDICATIONS    Medication How It Works Important Notes  Flonase Sensimist (fluticasone furoate) Reduces inflammation in nasal passages, addressing the underlying cause of allergy symptoms - Takes several days for full effect - Use daily for best results - Safe for long-term use   Xyzal  (levocetirizine) Blocks histamine to reduce allergy symptoms like sneezing and itching - Take at the same time each day - May cause drowsiness in some people - Once-daily dosing   Benadryl (diphenhydramine) Antihistamine that provides additional relief for breakthrough symptoms - Causes drowsiness - Use only at bedtime - For occasional use when needed   Saline Spray/Rinse Physically removes allergens and moistens nasal passages - Safe to use frequently - Improves effectiveness of other treatments - Reduces nasal irritation    CONTACT YOUR PROVIDER IF: Your symptoms do not improve after 1-2 weeks of following this plan You develop sinus pain with fever or green/yellow discharge You experience frequent nosebleeds You develop new or worsening symptoms You have questions about your treatment plan  ADDITIONAL ALLERGY MANAGEMENT TIPS   HELPFUL STRATEGIES: ?? Keep windows closed during high pollen seasons ??? Use allergen-proof covers for pillows and mattresses ?? Vacuum regularly with a HEPA filter vacuum ?? Shower and change clothes after spending time outdoors ?? Check local pollen counts and limit outdoor time when counts are high ?? Stay well-hydrated to help keep mucous membranes moist

## 2024-01-21 NOTE — Progress Notes (Signed)
 ==============================   [facility]: [id number]   -- Medical Office Visit --  Patient: Jill Ashley      Age: 58 y.o.       Sex:  female  Date:   01/21/2024 Today's Healthcare Provider: Bernardino KANDICE Cone, MD  ==============================   Chief Complaint: Cough and Nasal Congestion (Has been going on since last Thursday )   Discussed the use of AI scribe software for clinical note transcription with the patient, who gave verbal consent to proceed.  History of Present Illness 58 year old female who presents with acute dry cough and congestion.  She has been feeling unwell since Wednesday, with symptoms worsening by Friday. She describes significant congestion and excessive mucus production, leading to a sore throat. The congestion has progressed to her chest, and she has experienced ear pressure and pain.  She experienced a single episode of diarrhea on Monday, which she attributes to her current illness. Her appetite has decreased, and she has primarily been consuming fluids. She also mentions experiencing body aches, which have improved over time.  Her sleep has been disrupted due to coughing and postnasal drip, although she managed to sleep a little better last night. She has started using nebulizer treatments and plans to use a neti pot for sinus rinsing. She has saline nasal sprays at home.  She has not been using Sudafed but is considering it to help with mucus. She mentions using Tussendia and has previously used 'pearls' for cough, although she is unsure of their effectiveness. She is also considering using Xyzal  to help with sleep without causing drowsiness.  No current ear pain. She reports that her previous ear pain has resolved. No significant fever, only a slight temperature increase. She is concerned about her symptoms affecting her ability to attend her son's wedding this Saturday.  She requests a work excuse for being  absent on Saturday, Sunday, and Monday due to her illness. She works at [employer] and wants to ensure her absence is documented. Feeling bad since 4 days , cough/congestion/mucous, sore throat, started to go down into the chest, ear pressure/pain, diarrhea x 1 bout only none sense due to not eating.   Previously myalgias but now its better as far as that goes but I don't feel great and especially at night not sleeping because cough and post nasal stuff and finally last night elevated pillows and slept a  little.  Started nebs treatment(s).   Background Reviewed: Problem List: has Cough variant asthma; Allergic rhinitis; Hypertension; Vitamin D  deficiency; and Postmenopausal estrogen deficiency on their problem list. Past Medical History:  has a past medical history of Allergy, Anxiety, Asthma (08/16/2023), Depression, High blood pressure, Hyperlipidemia, Recurrent urticaria (05/25/2019), Upper airway cough syndrome (08/16/2023), and Urticaria. Past Surgical History:   has a past surgical history that includes Adenoidectomy; Tonsillectomy; Breast excisional biopsy (Left, 2008); and Cesarean section. Social History:   reports that she quit smoking about 19 years ago. Her smoking use included cigarettes. She started smoking about 30 years ago. She has a 2.8 pack-year smoking history. She has never used smokeless tobacco. She reports that she does not currently use alcohol. She reports that she does not use drugs. Family History:  family history includes Diabetes in her father; Hypertension in her father; Varicose Veins in her maternal grandmother and mother. Allergies:  is allergic to penicillins and morphine and codeine.   Medication Reconciliation: Current Outpatient Medications on File Prior to Visit  Medication Sig  albuterol  (VENTOLIN  HFA) 108 (90 Base) MCG/ACT inhaler Inhale 2 puffs into the lungs every 4 (four) hours as needed for wheezing or shortness of breath.   amLODipine  (NORVASC ) 5 MG  tablet TAKE 1 TABLET BY MOUTH ONCE A DAY   Ascorbic Acid (VITAMIN C PO) Take by mouth.   BIOTIN PO Take by mouth.   budesonide -formoterol  (SYMBICORT ) 160-4.5 MCG/ACT inhaler Inhale 2 puffs into the lungs in the morning and at bedtime.   buPROPion (WELLBUTRIN SR) 200 MG 12 hr tablet Take 200 mg by mouth every morning.   busPIRone (BUSPAR) 15 MG tablet Take 15 mg by mouth 2 (two) times daily.   cholecalciferol (VITAMIN D3) 25 MCG (1000 UNIT) tablet Take 1 tablet (1,000 Units total) by mouth daily at 6 (six) AM.   citalopram (CELEXA) 20 MG tablet Take 20 mg by mouth every morning.   Esketamine HCl, 84 MG Dose, (SPRAVATO, 84 MG DOSE,) 28 MG/DEVICE SOPK Place 84 mg into the nose 2 (two) times a week.   ipratropium-albuterol  (DUONEB) 0.5-2.5 (3) MG/3ML SOLN Take 3 mLs by nebulization every 6 (six) hours as needed.   montelukast  (SINGULAIR ) 10 MG tablet Take 1 tablet (10 mg total) by mouth daily.   multivitamin (CENTRUM) chewable tablet Chew 1 tablet by mouth daily.   Norethindrone-Ethinyl Estradiol -Fe Biphas (LO LOESTRIN FE ) 1 MG-10 MCG / 10 MCG tablet TAKE 1 TABLET BY MOUTH ONCE A DAY   progesterone  (PROMETRIUM ) 100 MG capsule Take 1 capsule (100 mg total) by mouth daily as directed   traZODone  (DESYREL ) 50 MG tablet Take 1 tablet (50 mg total) by mouth at bedtime as needed.   triamcinolone  (NASACORT  ALLERGY 24HR) 55 MCG/ACT AERO nasal inhaler 1 spray in each nostril Nasally Once a day   No current facility-administered medications on file prior to visit.  There are no discontinued medications.   Physical Exam:    01/21/2024   10:09 AM 01/02/2024   10:20 AM 08/15/2023    1:56 PM  Vitals with BMI  Height 5' 2 5' 2 5' 2  Weight 153 lbs 3 oz 154 lbs 6 oz 156 lbs 10 oz  BMI 28.01 28.23 28.64  Systolic 120 126 90  Diastolic 76 80 60  Pulse 77 78 81  Vital signs reviewed.  Nursing notes reviewed. Weight trend reviewed. Physical Activity: Sufficiently Active (01/02/2024)   Exercise Vital Sign     Days of Exercise per Week: 4 days    Minutes of Exercise per Session: 150+ min   General Appearance:  No acute distress appreciable.   Well-groomed, healthy-appearing female.  Well proportioned with no abnormal fat distribution.  Good muscle tone. Pulmonary:  Normal work of breathing at rest, no respiratory distress apparent. SpO2: 98 %  Musculoskeletal: All extremities are intact.  Neurological:  Awake, alert, oriented, and engaged.  No obvious focal neurological deficits or cognitive impairments.  Sensorium seems unclouded.   Speech is clear and coherent with logical content. Psychiatric:  Appropriate mood, pleasant and cooperative demeanor, thoughtful and engaged during the exam   Verbalized to patient: Physical Exam VITALS: P- 77, BP- 120/ HEENT: Clear fluid behind right eardrum, fluid behind left eardrum less clear, normal oropharynx CHEST: Clear to auscultation bilaterally CARDIOVASCULAR: Normal heart rate and rhythm, S1 and S2 normal without murmurs   Results:   Verbalized to patient: Results LABS   COVID-19 test: Negative (01/21/2024)     01/02/2024   10:39 AM  PHQ 2/9 Scores  PHQ - 2 Score 3  PHQ-  9 Score 13   Office Visit on 01/21/2024  Component Date Value Ref Range Status   SARS Coronavirus 2 Ag 01/21/2024 Negative  Negative Final   Influenza A, POC 01/21/2024 Negative  Negative Final   Influenza B, POC 01/21/2024 Negative  Negative Final  Office Visit on 01/02/2024  Component Date Value Ref Range Status   Cholesterol 01/02/2024 232 (H)  0 - 200 mg/dL Final   Triglycerides 90/81/7974 165.0 (H)  0.0 - 149.0 mg/dL Final   HDL 90/81/7974 55.00  >39.00 mg/dL Final   VLDL 90/81/7974 33.0  0.0 - 40.0 mg/dL Final   LDL Cholesterol 01/02/2024 144 (H)  0 - 99 mg/dL Final   Total CHOL/HDL Ratio 01/02/2024 4   Final   NonHDL 01/02/2024 177.07   Final   Sodium 01/02/2024 138  135 - 145 mEq/L Final   Potassium 01/02/2024 4.4  3.5 - 5.1 mEq/L Final   Chloride 01/02/2024  104  96 - 112 mEq/L Final   CO2 01/02/2024 29  19 - 32 mEq/L Final   Glucose, Bld 01/02/2024 83  70 - 99 mg/dL Final   BUN 90/81/7974 14  6 - 23 mg/dL Final   Creatinine, Ser 01/02/2024 0.88  0.40 - 1.20 mg/dL Final   Total Bilirubin 01/02/2024 0.4  0.2 - 1.2 mg/dL Final   Alkaline Phosphatase 01/02/2024 34 (L)  39 - 117 U/L Final   AST 01/02/2024 14  0 - 37 U/L Final   ALT 01/02/2024 10  0 - 35 U/L Final   Total Protein 01/02/2024 6.8  6.0 - 8.3 g/dL Final   Albumin 90/81/7974 4.1  3.5 - 5.2 g/dL Final   GFR 90/81/7974 72.58  >60.00 mL/min Final   Calcium 01/02/2024 9.3  8.4 - 10.5 mg/dL Final   WBC 90/81/7974 3.9 (L)  4.0 - 10.5 K/uL Final   RBC 01/02/2024 4.62  3.87 - 5.11 Mil/uL Final   Hemoglobin 01/02/2024 14.0  12.0 - 15.0 g/dL Final   HCT 90/81/7974 42.0  36.0 - 46.0 % Final   MCV 01/02/2024 90.8  78.0 - 100.0 fl Final   MCHC 01/02/2024 33.3  30.0 - 36.0 g/dL Final   RDW 90/81/7974 13.0  11.5 - 15.5 % Final   Platelets 01/02/2024 224.0  150.0 - 400.0 K/uL Final   Neutrophils Relative % 01/02/2024 51.0  43.0 - 77.0 % Final   Lymphocytes Relative 01/02/2024 35.2  12.0 - 46.0 % Final   Monocytes Relative 01/02/2024 9.9  3.0 - 12.0 % Final   Eosinophils Relative 01/02/2024 3.2  0.0 - 5.0 % Final   Basophils Relative 01/02/2024 0.7  0.0 - 3.0 % Final   Neutro Abs 01/02/2024 2.0  1.4 - 7.7 K/uL Final   Lymphs Abs 01/02/2024 1.4  0.7 - 4.0 K/uL Final   Monocytes Absolute 01/02/2024 0.4  0.1 - 1.0 K/uL Final   Eosinophils Absolute 01/02/2024 0.1  0.0 - 0.7 K/uL Final   Basophils Absolute 01/02/2024 0.0  0.0 - 0.1 K/uL Final   TSH 01/02/2024 1.280  0.450 - 4.500 uIU/mL Final   VITD 01/02/2024 66.88  30.00 - 100.00 ng/mL Final  Hospital Outpatient Visit on 08/15/2023  Component Date Value Ref Range Status   WBC 08/15/2023 5.0  4.0 - 10.5 K/uL Final   RBC 08/15/2023 4.51  3.87 - 5.11 MIL/uL Final   Hemoglobin 08/15/2023 13.7  12.0 - 15.0 g/dL Final   HCT 94/98/7974 41.0  36.0 -  46.0 % Final   MCV 08/15/2023 90.9  80.0 - 100.0 fL Final   MCH 08/15/2023 30.4  26.0 - 34.0 pg Final   MCHC 08/15/2023 33.4  30.0 - 36.0 g/dL Final   RDW 94/98/7974 13.6  11.5 - 15.5 % Final   Platelets 08/15/2023 238  150 - 400 K/uL Final   nRBC 08/15/2023 0.0  0.0 - 0.2 % Final   Neutrophils Relative % 08/15/2023 57  % Final   Neutro Abs 08/15/2023 2.9  1.7 - 7.7 K/uL Final   Lymphocytes Relative 08/15/2023 30  % Final   Lymphs Abs 08/15/2023 1.5  0.7 - 4.0 K/uL Final   Monocytes Relative 08/15/2023 10  % Final   Monocytes Absolute 08/15/2023 0.5  0.1 - 1.0 K/uL Final   Eosinophils Relative 08/15/2023 2  % Final   Eosinophils Absolute 08/15/2023 0.1  0.0 - 0.5 K/uL Final   Basophils Relative 08/15/2023 1  % Final   Basophils Absolute 08/15/2023 0.0  0.0 - 0.1 K/uL Final   Immature Granulocytes 08/15/2023 0  % Final   Abs Immature Granulocytes 08/15/2023 0.00  0.00 - 0.07 K/uL Final   Class Description Allergens 08/15/2023 Comment   Final   IgE (Immunoglobulin E), Serum 08/15/2023 24  6 - 495 IU/mL Final   D Pteronyssinus IgE 08/15/2023 <0.10  Class 0 kU/L Final   D Farinae IgE 08/15/2023 <0.10  Class 0 kU/L Final   Cat Dander IgE 08/15/2023 <0.10  Class 0 kU/L Final   Dog Dander IgE 08/15/2023 <0.10  Class 0 kU/L Final   [place] Grass IgE 08/15/2023 <0.10  Class 0 kU/L Final   Timothy Grass IgE 08/15/2023 <0.10  Class 0 kU/L Final   [place]  Bluegrass IgE 08/15/2023 <0.10  Class 0 kU/L Final   [place] Grass IgE 08/15/2023 <0.10  Class 0 kU/L Final   [place] Grass IgE 08/15/2023 <0.10  Class 0 kU/L Final   Cockroach, American IgE 08/15/2023 <0.10  Class 0 kU/L Final   Penicillium Chrysogen IgE 08/15/2023 <0.10  Class 0 kU/L Final   Cladosporium Herbarum IgE 08/15/2023 <0.10  Class 0 kU/L Final   Aspergillus Fumigatus IgE 08/15/2023 <0.10  Class 0 kU/L Final   Mucor Racemosus IgE 08/15/2023 <0.10  Class 0 kU/L Final   Alternaria Alternata IgE 08/15/2023 <0.10  Class 0 kU/L Final    Setomelanomma Rostrat 08/15/2023 <0.10  Class 0 kU/L Final   Oak, White IgE 08/15/2023 <0.10  Class 0 kU/L Final   Elm, American IgE 08/15/2023 <0.10  Class 0 kU/L Final   Maple/Box Elder IgE 08/15/2023 <0.10  Class 0 kU/L Final   Common Silver Valrie IgE 08/15/2023 <0.10  Class 0 kU/L Final   Hickory, White IgE 08/15/2023 <0.10  Class 0 kU/L Final   White Mulberry IgE 08/15/2023 <0.10  Class 0 kU/L Final   Cedar, [place] IgE 08/15/2023 <0.10  Class 0 kU/L Final   Ragweed, Short IgE 08/15/2023 <0.10  Class 0 kU/L Final   Plantain, English IgE 08/15/2023 <0.10  Class 0 kU/L Final   Cocklebur IgE 08/15/2023 <0.10  Class 0 kU/L Final   Pigweed, Rough IgE 08/15/2023 <0.10  Class 0 kU/L Final  Office Visit on 08/15/2023  Component Date Value Ref Range Status   Nitric Oxide  08/15/2023 23   Final  Admission on 07/21/2023, Discharged on 07/21/2023  Component Date Value Ref Range Status   Sodium 07/21/2023 136  135 - 145 mmol/L Final   Potassium 07/21/2023 3.7  3.5 - 5.1 mmol/L Final   Chloride 07/21/2023 103  98 -  111 mmol/L Final   CO2 07/21/2023 25  22 - 32 mmol/L Final   Glucose, Bld 07/21/2023 103 (H)  70 - 99 mg/dL Final   BUN 95/93/7974 18  6 - 20 mg/dL Final   Creatinine, Ser 07/21/2023 0.78  0.44 - 1.00 mg/dL Final   Calcium 95/93/7974 9.4  8.9 - 10.3 mg/dL Final   GFR, Estimated 07/21/2023 >60  >60 mL/min Final   Anion gap 07/21/2023 8  5 - 15 Final   WBC 07/21/2023 5.2  4.0 - 10.5 K/uL Final   RBC 07/21/2023 4.27  3.87 - 5.11 MIL/uL Final   Hemoglobin 07/21/2023 12.8  12.0 - 15.0 g/dL Final   HCT 95/93/7974 37.9  36.0 - 46.0 % Final   MCV 07/21/2023 88.8  80.0 - 100.0 fL Final   MCH 07/21/2023 30.0  26.0 - 34.0 pg Final   MCHC 07/21/2023 33.8  30.0 - 36.0 g/dL Final   RDW 95/93/7974 13.4  11.5 - 15.5 % Final   Platelets 07/21/2023 236  150 - 400 K/uL Final   nRBC 07/21/2023 0.0  0.0 - 0.2 % Final   Troponin I (High Sensitivity) 07/21/2023 <2  <18 ng/L Final  No image results  found. No results found.       ASSESSMENT & PLAN   Assessment & Plan Acute cough Nasal congestion Chronic otitis media, unspecified otitis media type Vaginal candidiasis Acute upper respiratory infection with sinus congestion and Eustachian tube dysfunction with middle ear effusion   She presents with an acute upper respiratory infection characterized by significant sinus congestion and Eustachian tube dysfunction, resulting in middle ear effusion. Symptoms include dry cough, sore throat, ear pressure, and mucus production. Viral infection, likely rhinovirus, is suspected, with bacterial sinus infection as a possibility. COVID-19 and flu are ruled out. Ear pain has resolved, but fluid remains behind the eardrum, indicating Eustachian tube blockage. Diarrhea resolved spontaneously, and body aches are improving. The primary goal is symptom management and prevention of bacterial infection. Prescribe Sudafed to reduce mucus production and congestion, and dextromethorphan for cough suppression. Recommend saline nasal spray for sinus rinsing to clear congestion and improve drainage. Advise against neti pots due to sterility concerns; recommend saline misting sprays instead. Prescribe Augmentin if ear pain returns or symptoms persist beyond 10 days, indicating bacterial infection. Prescribe Monistat to prevent yeast infection if antibiotics are used. Discuss potential side effects of antibiotics, including the risk of C. difficile infection. Provide a work excuse for October 4th to 6th due to illness.  ORDER ASSOCIATIONS  #   DIAGNOSIS / CONDITION ICD-10 ENCOUNTER ORDER     ICD-10-CM   1. Acute cough  R05.1 POC COVID-19 BinaxNow    POCT Influenza A/B    pseudoephedrine (SUDAFED 12 HOUR) 120 MG 12 hr tablet    dextromethorphan-guaiFENesin  (TUSSIN DM) 10-100 MG/5ML liquid    benzonatate  (TESSALON ) 100 MG capsule    levocetirizine (XYZAL ) 5 MG tablet    2. Nasal congestion  R09.81 POC COVID-19  BinaxNow    POCT Influenza A/B    3. Chronic otitis media, unspecified otitis media type  H66.90 doxycycline (VIBRA-TABS) 100 MG tablet    4. Vaginal candidiasis  B37.31 fluconazole (DIFLUCAN) 150 MG tablet           Orders Placed in Encounter:   Lab Orders         POC COVID-19 BinaxNow         POCT Influenza A/B       Meds ordered this  encounter  Medications   pseudoephedrine (SUDAFED 12 HOUR) 120 MG 12 hr tablet    Sig: Take 1 tablet (120 mg total) by mouth 2 (two) times daily. Discontinue if any shortness of breath, chest pain, or heartbeat irregularities.    Dispense:  20 tablet    Refill:  0   dextromethorphan-guaiFENesin  (TUSSIN DM) 10-100 MG/5ML liquid    Sig: Take 10 mLs by mouth every 4 (four) hours as needed for cough.    Dispense:  120 mL    Refill:  1   benzonatate  (TESSALON ) 100 MG capsule    Sig: Take 1 capsule (100 mg total) by mouth 2 (two) times daily as needed for cough.    Dispense:  20 capsule    Refill:  0   levocetirizine (XYZAL ) 5 MG tablet    Sig: Take 1 tablet (5 mg total) by mouth every evening.    Dispense:  30 tablet    Refill:  2   doxycycline (VIBRA-TABS) 100 MG tablet    Sig: Take 1 tablet (100 mg total) by mouth 2 (two) times daily.    Dispense:  20 tablet    Refill:  0   fluconazole (DIFLUCAN) 150 MG tablet    Sig: Take 1 tablet (150 mg total) by mouth every three (3) days as needed.    Dispense:  2 tablet    Refill:  0    Results for orders placed or performed in visit on 01/21/24  POCT Influenza A/B   Collection Time: 01/21/24 10:11 AM  Result Value Ref Range   Influenza A, POC Negative Negative   Influenza B, POC Negative Negative  POC COVID-19 BinaxNow   Collection Time: 01/21/24 10:12 AM  Result Value Ref Range   SARS Coronavirus 2 Ag Negative Negative        This document was synthesized by artificial intelligence (Abridge) using HIPAA-compliant recording of the clinical interaction;   We discussed the use of AI scribe  software for clinical note transcription with the patient, who gave verbal consent to proceed. additional Info: This encounter employed state-of-the-art, real-time, collaborative documentation. The patient actively reviewed and assisted in updating their electronic medical record on a shared screen, ensuring transparency and facilitating joint problem-solving for the problem list, overview, and plan. This approach promotes accurate, informed care. The treatment plan was discussed and reviewed in detail, including medication safety, potential side effects, and all patient questions. We confirmed understanding and comfort with the plan. Follow-up instructions were established, including contacting the office for any concerns, returning if symptoms worsen, persist, or new symptoms develop, and precautions for potential emergency department visits.

## 2024-01-22 DIAGNOSIS — F332 Major depressive disorder, recurrent severe without psychotic features: Secondary | ICD-10-CM | POA: Diagnosis not present

## 2024-01-27 DIAGNOSIS — F332 Major depressive disorder, recurrent severe without psychotic features: Secondary | ICD-10-CM | POA: Diagnosis not present

## 2024-01-28 DIAGNOSIS — F332 Major depressive disorder, recurrent severe without psychotic features: Secondary | ICD-10-CM | POA: Diagnosis not present

## 2024-01-28 DIAGNOSIS — F4312 Post-traumatic stress disorder, chronic: Secondary | ICD-10-CM | POA: Diagnosis not present

## 2024-01-29 DIAGNOSIS — F4312 Post-traumatic stress disorder, chronic: Secondary | ICD-10-CM | POA: Diagnosis not present

## 2024-01-29 DIAGNOSIS — F902 Attention-deficit hyperactivity disorder, combined type: Secondary | ICD-10-CM | POA: Diagnosis not present

## 2024-01-29 DIAGNOSIS — F332 Major depressive disorder, recurrent severe without psychotic features: Secondary | ICD-10-CM | POA: Diagnosis not present

## 2024-01-30 DIAGNOSIS — F332 Major depressive disorder, recurrent severe without psychotic features: Secondary | ICD-10-CM | POA: Diagnosis not present

## 2024-01-31 DIAGNOSIS — F332 Major depressive disorder, recurrent severe without psychotic features: Secondary | ICD-10-CM | POA: Diagnosis not present

## 2024-02-04 DIAGNOSIS — F332 Major depressive disorder, recurrent severe without psychotic features: Secondary | ICD-10-CM | POA: Diagnosis not present

## 2024-02-04 DIAGNOSIS — F4312 Post-traumatic stress disorder, chronic: Secondary | ICD-10-CM | POA: Diagnosis not present

## 2024-02-05 DIAGNOSIS — F332 Major depressive disorder, recurrent severe without psychotic features: Secondary | ICD-10-CM | POA: Diagnosis not present

## 2024-02-07 DIAGNOSIS — F332 Major depressive disorder, recurrent severe without psychotic features: Secondary | ICD-10-CM | POA: Diagnosis not present

## 2024-02-10 DIAGNOSIS — F332 Major depressive disorder, recurrent severe without psychotic features: Secondary | ICD-10-CM | POA: Diagnosis not present

## 2024-02-14 DIAGNOSIS — F332 Major depressive disorder, recurrent severe without psychotic features: Secondary | ICD-10-CM | POA: Diagnosis not present

## 2024-02-17 DIAGNOSIS — F332 Major depressive disorder, recurrent severe without psychotic features: Secondary | ICD-10-CM | POA: Diagnosis not present

## 2024-02-18 DIAGNOSIS — F332 Major depressive disorder, recurrent severe without psychotic features: Secondary | ICD-10-CM | POA: Diagnosis not present

## 2024-02-18 DIAGNOSIS — F4312 Post-traumatic stress disorder, chronic: Secondary | ICD-10-CM | POA: Diagnosis not present

## 2024-02-19 DIAGNOSIS — F4312 Post-traumatic stress disorder, chronic: Secondary | ICD-10-CM | POA: Diagnosis not present

## 2024-02-19 DIAGNOSIS — F902 Attention-deficit hyperactivity disorder, combined type: Secondary | ICD-10-CM | POA: Diagnosis not present

## 2024-02-21 DIAGNOSIS — F332 Major depressive disorder, recurrent severe without psychotic features: Secondary | ICD-10-CM | POA: Diagnosis not present

## 2024-02-24 DIAGNOSIS — F332 Major depressive disorder, recurrent severe without psychotic features: Secondary | ICD-10-CM | POA: Diagnosis not present

## 2024-02-26 ENCOUNTER — Ambulatory Visit: Admitting: Internal Medicine

## 2024-02-27 DIAGNOSIS — F332 Major depressive disorder, recurrent severe without psychotic features: Secondary | ICD-10-CM | POA: Diagnosis not present

## 2024-03-03 DIAGNOSIS — F332 Major depressive disorder, recurrent severe without psychotic features: Secondary | ICD-10-CM | POA: Diagnosis not present

## 2024-03-09 DIAGNOSIS — F332 Major depressive disorder, recurrent severe without psychotic features: Secondary | ICD-10-CM | POA: Diagnosis not present

## 2024-03-10 DIAGNOSIS — F332 Major depressive disorder, recurrent severe without psychotic features: Secondary | ICD-10-CM | POA: Diagnosis not present

## 2024-03-17 DIAGNOSIS — F332 Major depressive disorder, recurrent severe without psychotic features: Secondary | ICD-10-CM | POA: Diagnosis not present

## 2024-03-20 DIAGNOSIS — F332 Major depressive disorder, recurrent severe without psychotic features: Secondary | ICD-10-CM | POA: Diagnosis not present

## 2024-03-23 DIAGNOSIS — F332 Major depressive disorder, recurrent severe without psychotic features: Secondary | ICD-10-CM | POA: Diagnosis not present

## 2024-03-24 DIAGNOSIS — F332 Major depressive disorder, recurrent severe without psychotic features: Secondary | ICD-10-CM | POA: Diagnosis not present

## 2024-03-31 DIAGNOSIS — F332 Major depressive disorder, recurrent severe without psychotic features: Secondary | ICD-10-CM | POA: Diagnosis not present

## 2024-04-02 DIAGNOSIS — F332 Major depressive disorder, recurrent severe without psychotic features: Secondary | ICD-10-CM | POA: Diagnosis not present

## 2024-04-06 DIAGNOSIS — F332 Major depressive disorder, recurrent severe without psychotic features: Secondary | ICD-10-CM | POA: Diagnosis not present

## 2024-04-10 DIAGNOSIS — F332 Major depressive disorder, recurrent severe without psychotic features: Secondary | ICD-10-CM | POA: Diagnosis not present

## 2024-04-13 DIAGNOSIS — F332 Major depressive disorder, recurrent severe without psychotic features: Secondary | ICD-10-CM | POA: Diagnosis not present

## 2024-04-28 ENCOUNTER — Other Ambulatory Visit (HOSPITAL_COMMUNITY): Payer: Self-pay

## 2024-04-30 ENCOUNTER — Other Ambulatory Visit: Payer: Self-pay

## 2024-04-30 MED ORDER — SPRAVATO (84 MG DOSE) 28 MG/DEVICE NA SOPK: PACK | NASAL | 0 refills | Status: DC

## 2024-05-05 ENCOUNTER — Other Ambulatory Visit: Payer: Self-pay

## 2024-05-05 MED ORDER — SPRAVATO (84 MG DOSE) 28 MG/DEVICE NA SOPK: PACK | NASAL | 0 refills | Status: AC

## 2024-05-15 ENCOUNTER — Other Ambulatory Visit: Payer: Self-pay
